# Patient Record
Sex: Female | Born: 2000 | Race: White | Hispanic: No | Marital: Single | Smoking: Current every day smoker
Health system: Southern US, Community
[De-identification: ages and names within clinical notes are randomized; demographics above are authoritative.]

## PROBLEM LIST (undated history)

## (undated) DIAGNOSIS — Z803 Family history of malignant neoplasm of breast: Secondary | ICD-10-CM

## (undated) HISTORY — DX: Family history of malignant neoplasm of breast: Z80.3

---
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
## 2020-02-06 ENCOUNTER — Ambulatory Visit (INDEPENDENT_AMBULATORY_CARE_PROVIDER_SITE_OTHER): Payer: 59 | Admitting: Physician Assistant

## 2020-02-06 ENCOUNTER — Encounter: Payer: Self-pay | Admitting: Physician Assistant

## 2020-02-06 ENCOUNTER — Other Ambulatory Visit: Payer: Self-pay

## 2020-02-06 DIAGNOSIS — F411 Generalized anxiety disorder: Secondary | ICD-10-CM | POA: Diagnosis not present

## 2020-02-06 DIAGNOSIS — F325 Major depressive disorder, single episode, in full remission: Secondary | ICD-10-CM | POA: Diagnosis not present

## 2020-02-06 MED ORDER — BUSPIRONE HCL 15 MG PO TABS: 15.0000 mg | ORAL_TABLET | Freq: Two times a day (BID) | ORAL | 3 refills | Status: DC

## 2020-02-06 NOTE — Progress Notes (Signed)
[facility]  Patient ID: Laura Lindsey,  MRN: [id number]  PCP: Jarrett Soho, PA-C  Date of Evaluation: 02/06/2020 Time spent:20 minutes  Chief Complaint:  Chief Complaint    Anxiety      HISTORY/CURRENT STATUS: HPI For routine follow-up   Still taking Buspar and doing well.  Panic attacks are extremely rare.  She does not even need the hydroxyzine anymore.  The generalized anxiety is well controlled.  She sleeps just fine.  She still works at [employer] but is looking into getting a job at [employer] [place].  Since we last talked, she and her boyfriend moved in together in a condo.  Things are going well in their relationship.  Patient denies loss of interest in usual activities and is able to enjoy things.  Denies decreased energy or motivation.  Appetite has not changed.  No extreme sadness, tearfulness, or feelings of hopelessness.  Denies any changes in concentration, making decisions or remembering things.  Denies suicidal or homicidal thoughts.  Denies dizziness, syncope, seizures, numbness, tingling, tremor, tics, unsteady gait, slurred speech, confusion. Denies muscle or joint pain, stiffness, or dystonia.  Individual Medical History/ Review of Systems: Changes? 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 Atarax, Zoloft, Cymbalta, Wellbutrin  Allergies: Robaxin [methocarbamol]  Current Medications:  Current Outpatient Medications:  .  busPIRone (BUSPAR) 15 MG tablet, Take 1 tablet (15 mg total) by mouth 2 (two) times daily., Disp: 180 tablet, Rfl: 3 .  norethindrone-ethinyl estradiol (JUNEL FE,GILDESS FE,LOESTRIN FE) 1-20 MG-MCG tablet, Take 1 tablet by mouth daily., Disp: , Rfl:  .  hydrOXYzine (ATARAX/VISTARIL) 10 MG tablet, TAKE 1-3 TABLETS (10-30 MG TOTAL) BY MOUTH 3 (THREE) TIMES DAILY AS NEEDED. (Patient not taking: Reported on 02/06/2020), Disp: 270  tablet, Rfl: 1 Medication Side Effects: none  Family Medical/ Social History: Changes? No  MENTAL HEALTH EXAM:  There were no vitals taken for this visit.There is no height or weight on file to calculate BMI.  General Appearance: Casual, Neat and Well Groomed  Eye Contact:  Good  Speech:  Clear and Coherent  Volume:  Normal  Mood:  Euthymic  Affect:  Appropriate  Thought Process:  Goal Directed  Orientation:  Full (Time, Place, and Person)  Thought Content: Logical   Suicidal Thoughts:  No  Homicidal Thoughts:  No  Memory:  WNL  Judgement:  Good  Insight:  Good  Psychomotor Activity:  Normal  Concentration:  Concentration: Good  Recall:  Good  Fund of Knowledge: Good  Language: Good  Assets:  Desire for Improvement  ADL's:  Intact  Cognition: WNL  Prognosis:  Good    DIAGNOSES:    ICD-10-CM   1. Generalized anxiety disorder  F41.1   2. 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 I provided 20 minutes of face-to-face time during this encounter. I am glad to see her doing well!   Continue BuSpar 15 mg 1 p.o. twice daily.  Return in 1 year.  Melony Overly, PA-C

## 2020-02-07 ENCOUNTER — Other Ambulatory Visit: Payer: Self-pay

## 2020-02-07 MED ORDER — BUSPIRONE HCL 15 MG PO TABS: 15.0000 mg | ORAL_TABLET | Freq: Two times a day (BID) | ORAL | 3 refills | Status: DC

## 2020-10-11 ENCOUNTER — Other Ambulatory Visit: Payer: Self-pay

## 2020-10-11 ENCOUNTER — Emergency Department (HOSPITAL_COMMUNITY)
Admission: EM | Admit: 2020-10-11 | Discharge: 2020-10-11 | Disposition: A | Discharge status: Home / Self Care | Payer: Managed Care, Other (non HMO) | Attending: Emergency Medicine | Admitting: Emergency Medicine

## 2020-10-11 ENCOUNTER — Emergency Department (HOSPITAL_COMMUNITY): Payer: Managed Care, Other (non HMO)

## 2020-10-11 DIAGNOSIS — F17290 Nicotine dependence, other tobacco product, uncomplicated: Secondary | ICD-10-CM | POA: Insufficient documentation

## 2020-10-11 DIAGNOSIS — Y990 Civilian activity done for income or pay: Secondary | ICD-10-CM | POA: Insufficient documentation

## 2020-10-11 DIAGNOSIS — S39012A Strain of muscle, fascia and tendon of lower back, initial encounter: Secondary | ICD-10-CM | POA: Insufficient documentation

## 2020-10-11 DIAGNOSIS — S3992XA Unspecified injury of lower back, initial encounter: Secondary | ICD-10-CM | POA: Diagnosis present

## 2020-10-11 DIAGNOSIS — R200 Anesthesia of skin: Secondary | ICD-10-CM | POA: Diagnosis not present

## 2020-10-11 DIAGNOSIS — Y93K3 Activity, grooming and shearing an animal: Secondary | ICD-10-CM | POA: Diagnosis not present

## 2020-10-11 DIAGNOSIS — X58XXXA Exposure to other specified factors, initial encounter: Secondary | ICD-10-CM | POA: Insufficient documentation

## 2020-10-11 DIAGNOSIS — M5416 Radiculopathy, lumbar region: Secondary | ICD-10-CM | POA: Diagnosis not present

## 2020-10-11 LAB — PREGNANCY, URINE: Preg Test, Ur: NEGATIVE

## 2020-10-11 MED ORDER — PREDNISONE 20 MG PO TABS
60.0000 mg | ORAL_TABLET | Freq: Once | ORAL | Status: AC
  Administered 2020-10-11: 60 mg via ORAL
  Filled 2020-10-11: qty 3

## 2020-10-11 MED ORDER — KETOROLAC TROMETHAMINE 30 MG/ML IJ SOLN
30.0000 mg | Freq: Once | INTRAMUSCULAR | Status: AC
  Administered 2020-10-11: 30 mg via INTRAMUSCULAR
  Filled 2020-10-11: qty 1

## 2020-10-11 MED ORDER — PREDNISONE 10 MG (21) PO TBPK: ORAL_TABLET | Freq: Every day | ORAL | 0 refills | Status: DC

## 2020-10-11 MED ORDER — ACETAMINOPHEN 325 MG PO TABS
650.0000 mg | ORAL_TABLET | Freq: Once | ORAL | Status: AC
  Administered 2020-10-11: 650 mg via ORAL
  Filled 2020-10-11: qty 2

## 2020-10-11 MED ORDER — CYCLOBENZAPRINE HCL 10 MG PO TABS: 10.0000 mg | ORAL_TABLET | Freq: Two times a day (BID) | ORAL | 0 refills | Status: DC | PRN

## 2020-10-11 MED ORDER — OXYCODONE-ACETAMINOPHEN 5-325 MG PO TABS
1.0000 | ORAL_TABLET | Freq: Once | ORAL | Status: AC
Start: 2020-10-11 — End: 2020-10-11
  Administered 2020-10-11: 1 via ORAL
  Filled 2020-10-11: qty 1

## 2020-10-11 NOTE — ED Triage Notes (Signed)
Pt complains of sciatic pain, radiating down right leg. She now reports her right leg feels numb and that back pain is worse.

## 2020-10-11 NOTE — ED Provider Notes (Signed)
[facility]-EMERGENCY DEPT Provider Note   CSN: [id number] Arrival date & time: 10/11/20  1227     History Chief Complaint  Patient presents with   Back Pain   Numbness    Laura Lindsey is a 20 y.o. female with a past medical history of prior spinal fusion surgery in 2016 presenting to the ED with a chief complaint of back pain.  States that she has had pain in her back for several years.  This worsened today while she was at work.  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  She started having worsening pain throughout her entire lower back.  Has paresthesias to the top of her foot and her upper thigh area.  Denies any saddle anesthesia, loss of bowel or bladder function, fever, chest pain, shortness of breath, injuries or falls or possibility of pregnancy.  She describes the sensation as " like when your arm falls asleep."  She has been taking NSAIDs with only minimal improvement in her symptoms.  HPI     No past medical history on file.  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     Family History  Problem Relation Age of Onset   Breast cancer Mother     Social History   Tobacco Use   Smoking status: Every Day    Pack years: 0.00    Types: E-cigarettes   Smokeless tobacco: Never  Substance Use Topics   Alcohol use: Yes    Alcohol/week: 2.0 standard drinks    Types: 2 Cans of beer per week   Drug use: Not Currently    Home Medications Prior to Admission medications   Medication Sig Start Date End Date Taking? Authorizing Provider  cyclobenzaprine (FLEXERIL) 10 MG tablet Take 1 tablet (10 mg total) by  mouth 2 (two) times daily as needed for muscle spasms. 10/11/20  Yes Ellese Julius, PA-C  predniSONE (STERAPRED UNI-PAK 21 TAB) 10 MG (21) TBPK tablet Take by mouth daily. Take 6 tabs by mouth daily  for 2 days, then 5 tabs for 2 days, then 4 tabs for 2 days, then 3 tabs for 2 days, 2 tabs for 2 days, then 1 tab by mouth daily for 2 days 10/11/20  Yes Illyana Schorsch, PA-C  busPIRone (BUSPAR) 15 MG tablet Take 1 tablet (15 mg total) by mouth 2 (two) times daily. 02/07/20   Melony Overly T, PA-C  hydrOXYzine (ATARAX/VISTARIL) 10 MG tablet TAKE 1-3 TABLETS (10-30 MG TOTAL) BY MOUTH 3 (THREE) TIMES DAILY AS NEEDED. Patient not taking: Reported on 02/06/2020 09/02/18   Melony Overly T, PA-C  norethindrone-ethinyl estradiol (JUNEL FE,GILDESS FE,LOESTRIN FE) 1-20 MG-MCG tablet Take 1 tablet by mouth daily.    [provider]    Allergies    Robaxin [methocarbamol]  Review of Systems   Review of Systems  Constitutional:  Negative for appetite change, chills and fever.  HENT:  Negative for ear pain, rhinorrhea, sneezing and sore throat.   Eyes:  Negative for photophobia and visual disturbance.  Respiratory:  Negative for cough, chest tightness, shortness of breath and wheezing.   Cardiovascular:  Negative for chest pain and palpitations.  Gastrointestinal:  Negative for abdominal pain, blood in stool, constipation, diarrhea, nausea and vomiting.  Genitourinary:  Negative for dysuria, hematuria and urgency.  Musculoskeletal:  Positive for back pain. Negative for myalgias.  Skin:  Negative for rash.  Neurological:  Negative for dizziness, weakness and light-headedness.   Physical Exam Updated Vital Signs BP 126/65   Pulse (!) 58   Temp 98.6 F (37 C) (Oral)   Resp 16   Ht 5\' 9"  (1.753 m)   Wt 62.8 kg   LMP 09/22/2020   SpO2 100%   BMI 20.43 kg/m   Physical Exam Vitals and nursing note reviewed.  Constitutional:      General: She is not in acute distress.    Appearance: She is  well-developed.  HENT:     Head: Normocephalic and atraumatic.     Nose: Nose normal.  Eyes:     General: No scleral icterus.       Left eye: No discharge.     Conjunctiva/sclera: Conjunctivae normal.  Cardiovascular:     Rate and Rhythm: Normal rate and regular rhythm.     Heart sounds: Normal heart sounds. No murmur heard.   No friction rub. No gallop.  Pulmonary:     Effort: Pulmonary effort is normal. No respiratory distress.     Breath sounds: Normal breath sounds.  Abdominal:     General: Bowel sounds are normal. There is no distension.     Palpations: Abdomen is soft.     Tenderness: There is no abdominal tenderness. There is no guarding.  Musculoskeletal:        General: Tenderness present. Normal range of motion.     Cervical back: Normal range of motion and neck supple.     Lumbar back: Tenderness present.     Comments: Tenderness to palpation of the lumbar spine and bilateral paraspinal musculature.  Strength 5/5 in bilateral lower extremities.  2+ DP pulse noted bilaterally.  Normal sensation to light touch of bilateral upper extremities.  No calf tenderness bilaterally.  No edema noted bilaterally.  No midline tenderness at the L-spine.  Ambulatory with normal gait. No objective signs of saddle anesthesia noted.  Skin:    General: Skin is warm and dry.     Findings: No rash.  Neurological:     Mental Status: She is alert.     Motor: No abnormal muscle tone.     Coordination: Coordination normal.    ED Results / Procedures / Treatments   Labs (all labs ordered are listed, but only abnormal results are displayed) Labs Reviewed  PREGNANCY, URINE    EKG None  Radiology DG Lumbar Spine Complete  Result Date: 10/11/2020 CLINICAL DATA:  Low back pain for 2 weeks with bilateral sciatica, initial encounter EXAM: LUMBAR SPINE - COMPLETE 4+ VIEW COMPARISON:  None. FINDINGS: Postsurgical changes are noted at the thoracolumbar junction extending to L2. Vertebral body  height is well maintained. No pars defects are noted. Disc space narrowing is noted from L3-S1. No soft tissue abnormality is noted. IMPRESSION: Postsurgical changes in the thoracolumbar spine. No acute abnormality is noted. Electronically Signed   By: 10/13/2020 M.D.   On: 10/11/2020 16:43    Procedures Procedures   Medications Ordered in ED Medications  ketorolac (TORADOL) 30 MG/ML injection 30 mg (30 mg Intramuscular Given 10/11/20 1714)  acetaminophen (TYLENOL) tablet 650 mg (650 mg Oral Given 10/11/20 1713)  oxyCODONE-acetaminophen (PERCOCET/ROXICET) 5-325 MG per tablet 1  tablet (1 tablet Oral Given 10/11/20 1713)  predniSONE (DELTASONE) tablet 60 mg (60 mg Oral Given 10/11/20 1713)    ED Course  I have reviewed the triage vital signs and the nursing notes.  Pertinent labs & imaging results that were available during my care of the patient were reviewed by me and considered in my medical decision making (see chart for details).    MDM Rules/Calculators/A&P                          20 year old female presenting to the ED with a chief complaint of back pain.  History of spinal fusion in 2016.  She has been dealing with pain for several years but this worsened today while at work.  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  Has been taking NSAIDs with minimal improvement today.  Reports sharp shooting pain down her right leg as well as paresthesias in her foot and thigh.  Denies any saddle anesthesia, loss of bowel or bladder function, fever, injury or fall, shortness of breath or possibility of pregnancy.  On exam patient with tenderness of the lumbar spine at the bilateral paraspinal musculature.  Strength 5/5 in bilateral lower extremities.  Normal sensation of lower extremities.  No objective signs of numbness.  She is ambulatory here.  Vital signs within normal limits.  X-ray without any acute findings.  She has no red flag symptoms that would concern me  for cauda equina, dissection or other surgical emergent cause.  Pregnancy test is negative.  Pain controlled here with medications.  She reports significant improvement and is requesting discharge.  States that she has set up appointment with her PCP and is interested in physical therapy.  Meantime we will treat symptoms at home with continued steroid pack and Flexeril.  Return precautions given.   Patient is hemodynamically stable, in NAD, and able to ambulate in the ED. Evaluation does not show pathology that would require ongoing emergent intervention or inpatient treatment. I explained the diagnosis to the patient. Pain has been managed and has no complaints prior to discharge. Patient is comfortable with above plan and is stable for discharge at this time. All questions were answered prior to disposition. Strict return precautions for returning to the ED were discussed. Encouraged follow up with PCP.   An After Visit Summary was printed and given to the patient.   Portions of this note were generated with Scientist, clinical (histocompatibility and immunogenetics). Dictation errors may occur despite best attempts at proofreading.  Final Clinical Impression(s) / ED Diagnoses Final diagnoses:  Lumbar radiculopathy  Strain of lumbar region, initial encounter    Rx / DC Orders ED Discharge Orders          Ordered    predniSONE (STERAPRED UNI-PAK 21 TAB) 10 MG (21) TBPK tablet  Daily        10/11/20 1800    cyclobenzaprine (FLEXERIL) 10 MG tablet  2 times daily PRN        10/11/20 1800             Dietrich Pates, PA-C 10/11/20 1801    Rolan Bucco, MD 10/11/20 2349

## 2020-10-11 NOTE — Discharge Instructions (Addendum)
Take the steroids beginning tomorrow. Take the muscle relaxer as needed in addition to Tylenol. Follow-up with your primary care provider. Return to the ER if you start to experience worsening back pain, losing control of her bowels or bladder, fever, injuries or falls.

## 2021-02-04 ENCOUNTER — Ambulatory Visit (INDEPENDENT_AMBULATORY_CARE_PROVIDER_SITE_OTHER): Payer: 59 | Admitting: Physician Assistant

## 2021-02-04 ENCOUNTER — Encounter: Payer: Self-pay | Admitting: Physician Assistant

## 2021-02-04 ENCOUNTER — Other Ambulatory Visit: Payer: Self-pay

## 2021-02-04 DIAGNOSIS — F325 Major depressive disorder, single episode, in full remission: Secondary | ICD-10-CM

## 2021-02-04 DIAGNOSIS — F4320 Adjustment disorder, unspecified: Secondary | ICD-10-CM | POA: Diagnosis not present

## 2021-02-04 DIAGNOSIS — F411 Generalized anxiety disorder: Secondary | ICD-10-CM

## 2021-02-04 MED ORDER — BUSPIRONE HCL 15 MG PO TABS: ORAL_TABLET | ORAL | 3 refills | Status: DC

## 2021-02-04 MED ORDER — DIAZEPAM 5 MG PO TABS: 2.5000 mg | ORAL_TABLET | Freq: Four times a day (QID) | ORAL | 0 refills | Status: DC | PRN

## 2021-02-04 NOTE — Progress Notes (Signed)
[facility]  Patient ID: Laura Lindsey,  MRN: [id number]  PCP: Jarrett Soho, PA-C  Date of Evaluation: 02/04/2021 Time spent:30 minutes  Chief Complaint:  Chief Complaint   Anxiety; Follow-up      HISTORY/CURRENT STATUS: HPI For routine follow-up   Has had bad news this year.  Her mom was diagnosed with stage IV breast cancer in April, when she went to the emergency room for back pain, thinking it was a kidney stone but then got that diagnosis.  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  She is not having any pain right now.  Laura Lindsey is understandably upset, "my emotions are all over the place.  You never know when I get really sad and anxious about it.  Of course I cry when I feel that way."  She is sleeping fairly well.  Energy and motivation are good.  Appetite has not changed and weight is stable.  She does feel sad a lot of course with everything going on.  Does not really want to add an antidepressant at this point but she does ask if we can do something as far as the BuSpar goes.  Up until the sad news, it was working well.  Denies any changes in concentration, making decisions or remembering things.  Denies suicidal or homicidal thoughts.  Denies dizziness, syncope, seizures, numbness, tingling, tremor, tics, unsteady gait, slurred speech, confusion. Denies muscle or joint pain, stiffness, or dystonia.  Individual Medical History/ Review of Systems: Changes? :Yes   had lumbar radiculopathy, had to go to the ER in June for that.  Had PT for 3 months.  Doing better now.  Past medications for mental health diagnoses include: Valium for muscle spasms in the past,  Atarax, Zoloft, Cymbalta, Wellbutrin  Allergies: Robaxin [methocarbamol]  Current Medications:  Current Outpatient Medications:    diazepam (VALIUM) 5 MG tablet, Take 0.5-1 tablets (2.5-5 mg total) by mouth every 6 (six) hours as needed for anxiety., Disp: 20 tablet,  Rfl: 0   norethindrone-ethinyl estradiol (JUNEL FE,GILDESS FE,LOESTRIN FE) 1-20 MG-MCG tablet, Take 1 tablet by mouth daily., Disp: , Rfl:    busPIRone (BUSPAR) 15 MG tablet, 2 po q morning, 1 po q evening., Disp: 270 tablet, Rfl: 3   hydrOXYzine (ATARAX/VISTARIL) 10 MG tablet, TAKE 1-3 TABLETS (10-30 MG TOTAL) BY MOUTH 3 (THREE) TIMES DAILY AS NEEDED. (Patient not taking: No sig reported), Disp: 270 tablet, Rfl: 1 Medication Side Effects: none  Family Medical/ Social History: Changes?  In April her Mom dx IV breast cancer, and a few weeks ago found out she has 6 months to live. She was dx originally around 2019.  She and BF live together. That's going well. 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  She bathes dogs and really loves it.  MENTAL HEALTH EXAM:  There were no vitals taken for this visit.There is no height or weight on file to calculate BMI.  General Appearance: Casual, Neat and Well Groomed  Eye Contact:  Good  Speech:  Clear and Coherent  Volume:  Normal  Mood:   Sad  Affect:  Congruent and Tearful  Thought Process:  Goal Directed  Orientation:  Full (Time, Place, and Person)  Thought Content: Logical   Suicidal Thoughts:  No  Homicidal Thoughts:  No  Memory:  WNL  Judgement:  Good  Insight:  Good  Psychomotor Activity:  Normal  Concentration:  Concentration: Good  Recall:  Good  Fund of Knowledge: Good  Language: Good  Assets:  Desire for Improvement  ADL's:  Intact  Cognition: WNL  Prognosis:  Good    DIAGNOSES:    ICD-10-CM   1. Generalized anxiety disorder  F41.1     2. Anticipatory grief  F43.20     3. 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  06/11/2020 hydrocodone filled.  No other controlled substances. I provided 30 minutes of face to face time during this encounter, including time spent before and after  the visit in records review, medical decision making, and charting.  I am sorry to hear this sad news.  [name] and I discussed increasing the BuSpar.  And I also recommend adding a benzodiazepine just in case she needs it.  She has taken Valium in the past for muscle spasms.  She was not addicted but in the past she said she did like the way she felt on them.  She is very aware that they are addictive and she will use with caution.  Benefits, risks, and side effects were discussed and she accepts.  Increasing the BuSpar will hopefully help prevent the need for a benzodiazepine. Increase BuSpar 15 mg to 2 p.o. every morning and 1 p.o. in the early evening. Start Valium 5 mg, 1/2-1 p.o. every 6 hours as needed anxiety. Consider counseling. Return in 2 months.  Melony Overly, PA-C

## 2021-02-25 ENCOUNTER — Ambulatory Visit: Payer: 59 | Admitting: Physician Assistant

## 2021-03-02 ENCOUNTER — Other Ambulatory Visit: Payer: Self-pay

## 2021-03-02 ENCOUNTER — Ambulatory Visit (INDEPENDENT_AMBULATORY_CARE_PROVIDER_SITE_OTHER): Payer: 59 | Admitting: Physician Assistant

## 2021-03-02 ENCOUNTER — Encounter: Payer: Self-pay | Admitting: Physician Assistant

## 2021-03-02 DIAGNOSIS — F422 Mixed obsessional thoughts and acts: Secondary | ICD-10-CM

## 2021-03-02 DIAGNOSIS — F411 Generalized anxiety disorder: Secondary | ICD-10-CM

## 2021-03-02 DIAGNOSIS — F4320 Adjustment disorder, unspecified: Secondary | ICD-10-CM | POA: Diagnosis not present

## 2021-03-02 MED ORDER — CITALOPRAM HYDROBROMIDE 10 MG PO TABS: 10.0000 mg | ORAL_TABLET | ORAL | 1 refills | Status: DC

## 2021-03-02 NOTE — Progress Notes (Signed)
[facility]  Patient ID: Laura Lindsey,  MRN: [id number]  PCP: Jarrett Soho, PA-C  Date of Evaluation: 03/02/2021 Time spent:30 minutes  Chief Complaint:  Chief Complaint   Anxiety; Depression; Follow-up     HISTORY/CURRENT STATUS: HPI For routine follow-up   Since increasing the Buspar, feels better with day to day stuff. But OCD sx of having to clean her house or she can't do anything else, there are things like emptying the dishwasher that if she does not do it before leaving for work she has to go back inside and do it.  She is also cleaning a lot at work.  The symptoms worsened about 1 month ago.  She has always had some OCD tendencies but never like this.  She is not having panic attacks.  She is having a hard time with fatigue, appetite is decreased and she is lost about 15 pounds in the past 3 or 4 months.  She gets hungry but then can only eat a few bites and gets nauseated.  She does not throw up.  She has a hard time enjoying things but does go to work.  Not missing any work.  Sleeps too much.  No self-harm.  She does cry easily.  No suicidal or homicidal thoughts.  Her mom has moved to [place] to be in the care of her parents.  She is needing 24/7 care now.  She stopped chemo.  That is hard on Laura Lindsey because she cannot pop over and see her at any time but she knows it is the best for her mom having that care.  She had been in and out of behavioral health and she needed more care.  Laura Lindsey has taken in the family cat since her mom's move.  She asked about having a note for an emotional support animal.  Her cat is indoors, sleeps most of the time and is a really sweet cat.  Patient denies increased energy with decreased need for sleep, no increased talkativeness, no racing thoughts, no impulsivity or risky behaviors, no increased spending, no increased libido, no grandiosity, no increased irritability or anger, and no hallucinations.  Denies dizziness, syncope,  seizures, numbness, tingling, tremor, tics, unsteady gait, slurred speech, confusion. Denies muscle or joint pain, stiffness, or dystonia.  Individual Medical History/ Review of Systems: Changes? :Yes   see HPI  Past medications for mental health diagnoses include: Valium for muscle spasms in the past,  Atarax, Zoloft, Cymbalta, Wellbutrin  Allergies: Robaxin [methocarbamol]  Current Medications:  Current Outpatient Medications:    busPIRone (BUSPAR) 15 MG tablet, 2 po q morning, 1 po q evening., Disp: 270 tablet, Rfl: 3   citalopram (CELEXA) 10 MG tablet, Take 1 tablet (10 mg total) by mouth every morning., Disp: 30 tablet, Rfl: 1   diazepam (VALIUM) 5 MG tablet, Take 0.5-1 tablets (2.5-5 mg total) by mouth every 6 (six) hours as needed for anxiety., Disp: 20 tablet, Rfl: 0   norethindrone-ethinyl estradiol (JUNEL FE,GILDESS FE,LOESTRIN FE) 1-20 MG-MCG tablet, Take 1 tablet by mouth daily., Disp: , Rfl:  Medication Side Effects: none  Family Medical/ Social History: Changes?  See HPI.   MENTAL HEALTH EXAM:  There were no vitals taken for this visit.There is no height or weight on file to calculate BMI.  General Appearance: Casual, Neat and Well Groomed  Eye Contact:  Good  Speech:  Clear and Coherent  Volume:  Normal  Mood:  Depressed  Affect:  Congruent and Depressed  Thought Process:  Goal Directed  Orientation:  Full (Time, Place, and Person)  Thought Content: Logical   Suicidal Thoughts:  No  Homicidal Thoughts:  No  Memory:  WNL  Judgement:  Good  Insight:  Good  Psychomotor Activity:  Normal  Concentration:  Concentration: Good  Recall:  Good  Fund of Knowledge: Good  Language: Good  Assets:  Desire for Improvement  ADL's:  Intact  Cognition: WNL  Prognosis:  Good    DIAGNOSES:    ICD-10-CM   1. Generalized anxiety disorder  F41.1     2. Mixed obsessional thoughts and acts  F42.2     3. Anticipatory grief  F43.20         Receiving Psychotherapy: Yes   Danae Orleans    RECOMMENDATIONS: PDMP was reviewed.  Valium filled 02/04/2021. I provided 30 minutes of face to face time during this encounter, including time spent before and after the visit in records review, medical decision making, and charting.  We discussed starting an antidepressant, not only for depression but also anxiety prevention and will help with OCD somewhat.  She has a couple of friends who take antidepressants, 1 is doing well on Celexa, the other 1 is doing well on Effexor.  Alyana has taken Cymbalta and had a horrible experience with that especially weaning off so we agree Effexor is not a good choice.  She understands that Celexa is in the same family of Zoloft but I think it would be better for her symptoms.  Benefits, risk, and side effects were discussed and she accepts and would like to try it. Note for an emotional support animal stating "Laura Lindsey has a cat who is an emotional support animal.  This is an important part of her treatment plan."  She will let me know if she needs a formal letter dictated which I will be glad to do. Start Celexa 10 mg, 1 p.o. every morning daily.  If it makes her drowsy at all she can change to evening. Continue BuSpar 15 mg, continue 2 p.o. every morning and 1 p.o. in the early evening. Start Valium 5 mg, 1/2-1 p.o. every 6 hours as needed anxiety. Continue counseling with Danae Orleans.  Return in 4-6 weeks.  Melony Overly, PA-C

## 2021-03-24 ENCOUNTER — Other Ambulatory Visit: Payer: Self-pay | Admitting: Physician Assistant

## 2021-04-21 ENCOUNTER — Other Ambulatory Visit: Payer: Self-pay | Admitting: Physician Assistant

## 2021-04-22 ENCOUNTER — Other Ambulatory Visit: Payer: Self-pay

## 2021-04-22 ENCOUNTER — Encounter: Payer: Self-pay | Admitting: Physician Assistant

## 2021-04-22 ENCOUNTER — Ambulatory Visit (INDEPENDENT_AMBULATORY_CARE_PROVIDER_SITE_OTHER): Payer: 59 | Admitting: Physician Assistant

## 2021-04-22 ENCOUNTER — Ambulatory Visit: Payer: 59 | Admitting: Physician Assistant

## 2021-04-22 DIAGNOSIS — F411 Generalized anxiety disorder: Secondary | ICD-10-CM

## 2021-04-22 DIAGNOSIS — F3341 Major depressive disorder, recurrent, in partial remission: Secondary | ICD-10-CM

## 2021-04-22 DIAGNOSIS — F422 Mixed obsessional thoughts and acts: Secondary | ICD-10-CM | POA: Diagnosis not present

## 2021-04-22 MED ORDER — CITALOPRAM HYDROBROMIDE 10 MG PO TABS: 10.0000 mg | ORAL_TABLET | ORAL | 3 refills | Status: DC

## 2021-04-22 NOTE — Progress Notes (Signed)
[facility]  Patient ID: Laura Lindsey,  MRN: [id number]  PCP: Jarrett Soho, PA-C  Date of Evaluation: 04/22/2021 Time spent:20 minutes  Chief Complaint:  Chief Complaint   Anxiety; Depression; Follow-up      HISTORY/CURRENT STATUS: HPI For routine follow-up   Started Celexa 6-7 weeks ago, it has helped with the OCD symptoms.  She still has what she calls "fits" where she will have obsessive thoughts or compulsions that she has to get up and cleaning everything or do whatever task it is she is thinking about.  Those only happen a couple of times a week at most.  And it is hard to say how long they last.  When it is really bad she takes a Valium and that helps.  Generalized anxiety is better.  BuSpar is helping that as well.  She is able to enjoy things as much as she can.  Feels that the Celexa has helped with symptoms of depression.  She was able to go see her mom over Christmas break.  Her mom has metastatic breast cancer and is doing pretty well mentally right now.  She has a PET scan coming up.  Deon's energy level and motivation are good.  Work is going fine.  She sleeps well.  ADLs are normal.  Personal hygiene is normal.  She is not isolating.  No suicidal or homicidal thoughts.  She denies increased energy with decreased need for sleep, no increased talkativeness, no racing thoughts, no impulsivity or risky behaviors, no increased spending, no increased libido, no grandiosity, no increased irritability or anger, and no hallucinations.  Denies dizziness, syncope, seizures, numbness, tingling, tremor, tics, unsteady gait, slurred speech, confusion. Denies muscle or joint pain, stiffness, or dystonia.  Individual Medical History/ Review of Systems: Changes? :No     Past medications for mental health diagnoses include: Valium for muscle spasms in the past,  Atarax, Zoloft, Cymbalta, Wellbutrin  Allergies: Robaxin [methocarbamol]  Current Medications:  Current  Outpatient Medications:    busPIRone (BUSPAR) 15 MG tablet, 2 po q morning, 1 po q evening., Disp: 270 tablet, Rfl: 3   diazepam (VALIUM) 5 MG tablet, Take 0.5-1 tablets (2.5-5 mg total) by mouth every 6 (six) hours as needed for anxiety., Disp: 20 tablet, Rfl: 0   norethindrone-ethinyl estradiol (JUNEL FE,GILDESS FE,LOESTRIN FE) 1-20 MG-MCG tablet, Take 1 tablet by mouth daily., Disp: , Rfl:    citalopram (CELEXA) 10 MG tablet, Take 1 tablet (10 mg total) by mouth every morning., Disp: 90 tablet, Rfl: 3 Medication Side Effects: sexual dysfunction since starting the Celexa, she has no sex drive at all.  She and her boyfriend feel that it is not a problem right now because she needs the Celexa.  Family Medical/ Social History: Changes?  See HPI.   MENTAL HEALTH EXAM:  There were no vitals taken for this visit.There is no height or weight on file to calculate BMI.  General Appearance: Casual, Neat and Well Groomed  Eye Contact:  Good  Speech:  Clear and Coherent  Volume:  Normal  Mood:  Euthymic  Affect:  Congruent  Thought Process:  Goal Directed and Descriptions of Associations: Circumstantial  Orientation:  Full (Time, Place, and Person)  Thought Content: Logical   Suicidal Thoughts:  No  Homicidal Thoughts:  No  Memory:  WNL  Judgement:  Good  Insight:  Good  Psychomotor Activity:  Normal  Concentration:  Concentration: Good  Recall:  Good  Fund of Knowledge: Good  Language: Good  Assets:  Desire for Improvement  ADL's:  Intact  Cognition: WNL  Prognosis:  Good    DIAGNOSES:    ICD-10-CM   1. Recurrent major depressive disorder, in partial remission (HCC)  F33.41     2. Mixed obsessional thoughts and acts  F42.2     3. Generalized anxiety disorder  F41.1        Receiving Psychotherapy: Yes  Danae Orleans    RECOMMENDATIONS: PDMP was reviewed.  Last Valium filled 02/04/2021. I provided 20 minutes of face to face time during this encounter, including time spent  before and after the visit in records review, medical decision making, counseling pertinent to today's visit, and charting.  I am glad to see her doing better.  It is good to hear that her mom is doing okay now as well. We discussed the decreased libido, she is willing to put up with it for now.  Neither of [place] want to add another medication such as Wellbutrin or try another medication that is also likely to cause sexual side effects so we will continue same treatment for now. Continue Celexa 10 mg, 1 p.o. daily.   Continue BuSpar 15 mg, continue 2 p.o. every morning and 1 p.o. in the early evening. Continue Valium 5 mg, 1/2-1 p.o. every 6 hours as needed anxiety.  Okay to refill when needed. Continue counseling with Danae Orleans.  Return in 3 months.  Melony Overly, PA-C

## 2021-07-22 ENCOUNTER — Ambulatory Visit (INDEPENDENT_AMBULATORY_CARE_PROVIDER_SITE_OTHER): Payer: 59 | Admitting: Physician Assistant

## 2021-07-22 ENCOUNTER — Encounter: Payer: Self-pay | Admitting: Physician Assistant

## 2021-07-22 DIAGNOSIS — F411 Generalized anxiety disorder: Secondary | ICD-10-CM

## 2021-07-22 DIAGNOSIS — F19981 Other psychoactive substance use, unspecified with psychoactive substance-induced sexual dysfunction: Secondary | ICD-10-CM

## 2021-07-22 DIAGNOSIS — F3341 Major depressive disorder, recurrent, in partial remission: Secondary | ICD-10-CM

## 2021-07-22 DIAGNOSIS — F422 Mixed obsessional thoughts and acts: Secondary | ICD-10-CM | POA: Diagnosis not present

## 2021-07-22 NOTE — Progress Notes (Signed)
[facility] ? ?Patient ID: Erroll Luna,  ?MRN: [id number] ? ?PCP: Jarrett Soho, PA-C ? ?Date of Evaluation: 07/22/2021 ?Time spent:20 minutes ? ?Chief Complaint:  ?Chief Complaint   ?Anxiety; Depression; Follow-up ?  ? ? ? ?HISTORY/CURRENT STATUS: ?HPI For routine follow-up  ? ?Doing really well. Celexa has helped so much with the anxiety and depression.  Her work is going well, she is not missing days.  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  Her mom is doing well (has metastatic breast cancer) so that is helpful too.  She is able to enjoy things.  Energy and motivation are good.  She has not been taking the Valium at all.  Wonders if she even needs the BuSpar anymore and has been thinking about going off, would like to know my opinion.  Sleeps well.  Not isolating.  ADLs and personal hygiene are normal.  Not obsessing about things like she did.  No skin picking or trichotillomania.  No suicidal or homicidal thoughts. ? ?She denies increased energy with decreased need for sleep, no increased talkativeness, no racing thoughts, no impulsivity or risky behaviors, no increased spending, no increased libido, no grandiosity, no increased irritability or anger, and no hallucinations. ? ?Denies dizziness, syncope, seizures, numbness, tingling, tremor, tics, unsteady gait, slurred speech, confusion. Denies muscle or joint pain, stiffness, or dystonia. ? ?Individual Medical History/ Review of Systems: Changes? :No    ? ?Past medications for mental health diagnoses include: ?Valium for muscle spasms in the past,  Atarax, Zoloft, Cymbalta, Wellbutrin ? ?Allergies: Robaxin [methocarbamol] ? ?Current Medications:  ?Current Outpatient Medications:  ?  busPIRone (BUSPAR) 15 MG tablet, 2 po q morning, 1 po q evening., Disp: 270 tablet, Rfl: 3 ?  citalopram (CELEXA) 10 MG tablet, Take 1 tablet (10 mg total) by mouth every morning., Disp: 90 tablet, Rfl: 3 ?  norethindrone-ethinyl estradiol (JUNEL  FE,GILDESS FE,LOESTRIN FE) 1-20 MG-MCG tablet, Take 1 tablet by mouth daily., Disp: , Rfl:  ?  diazepam (VALIUM) 5 MG tablet, Take 0.5-1 tablets (2.5-5 mg total) by mouth every 6 (six) hours as needed for anxiety. (Patient not taking: Reported on 07/22/2021), Disp: 20 tablet, Rfl: 0 ?Medication Side Effects: sexual dysfunction since starting the Celexa,  ? ?Family Medical/ Social History: Changes?  ?See HPI. ? ? ?MENTAL HEALTH EXAM: ? ?There were no vitals taken for this visit.There is no height or weight on file to calculate BMI.  ?General Appearance: Casual, Neat and Well Groomed  ?Eye Contact:  Good  ?Speech:  Clear and Coherent  ?Volume:  Normal  ?Mood:  Euthymic  ?Affect:  Congruent  ?Thought Process:  Goal Directed and Descriptions of Associations: Circumstantial  ?Orientation:  Full (Time, Place, and Person)  ?Thought Content: Logical   ?Suicidal Thoughts:  No  ?Homicidal Thoughts:  No  ?Memory:  WNL  ?Judgement:  Good  ?Insight:  Good  ?Psychomotor Activity:  Normal  ?Concentration:  Concentration: Good and Attention Span: Good  ?Recall:  Good  ?Fund of Knowledge: Good  ?Language: Good  ?Assets:  Desire for Improvement  ?ADL's:  Intact  ?Cognition: WNL  ?Prognosis:  Good  ? ? ?DIAGNOSES:  ?  ICD-10-CM   ?1. Recurrent major depressive disorder, in partial remission (HCC)  F33.41   ?  ?2. Generalized anxiety disorder  F41.1   ?  ?3. Mixed obsessional thoughts and acts  F42.2   ?  ?4. 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  ? ? ?  Receiving Psychotherapy: No  Danae Orleans in the past. ? ? ?RECOMMENDATIONS: ?PDMP was reviewed.  Last Valium filled 02/04/2021. ?I provided 20 minutes of face to face time during this encounter, including time spent before and after the visit in records review, medical decision making, counseling pertinent to today's visit, and charting.  ?Discussed the sexual side effects.  Unfortunately it is a side effect most likely of the Celexa over BuSpar however she  does not feel like she needs the BuSpar anymore so she can wean off that by taking 15 mg twice daily for 4 days then 15 mg daily for 4 days and then stop.  Weaning is not necessary but I always think it is best.  And if she wants to wean longer that is fine too. Might consider decreasing the Celexa in the future to see if that will help with the sexual side effects, adding Wellbutrin may be helpful as well.  For now no changes with that. ? ?Continue Celexa 10 mg, 1 p.o. daily.   ?Wean off BuSpar as noted above.   ?Continue Valium 5 mg, 1/2-1 p.o. every 6 hours as needed anxiety.  She has not taken this in a long time.   ?Restart therapy with Danae Orleans as needed. ?Return in 6 months. ? ?Melony Overly, PA-C  ?

## 2022-01-20 ENCOUNTER — Encounter: Payer: Self-pay | Admitting: Physician Assistant

## 2022-01-20 ENCOUNTER — Ambulatory Visit (INDEPENDENT_AMBULATORY_CARE_PROVIDER_SITE_OTHER): Payer: 59 | Admitting: Physician Assistant

## 2022-01-20 DIAGNOSIS — F411 Generalized anxiety disorder: Secondary | ICD-10-CM

## 2022-01-20 DIAGNOSIS — F422 Mixed obsessional thoughts and acts: Secondary | ICD-10-CM | POA: Diagnosis not present

## 2022-01-20 DIAGNOSIS — F331 Major depressive disorder, recurrent, moderate: Secondary | ICD-10-CM

## 2022-01-20 MED ORDER — DIAZEPAM 5 MG PO TABS: 2.5000 mg | ORAL_TABLET | Freq: Four times a day (QID) | ORAL | 0 refills | Status: DC | PRN

## 2022-01-20 MED ORDER — CITALOPRAM HYDROBROMIDE 20 MG PO TABS: 20.0000 mg | ORAL_TABLET | Freq: Every day | ORAL | 1 refills | Status: DC

## 2022-01-20 NOTE — Progress Notes (Signed)
[facility]  Patient ID: Laura Lindsey,  MRN: [id number]  PCP: Jarrett Soho, PA-C  Date of Evaluation: 01/20/2022 Time spent:20 minutes  Chief Complaint:  Chief Complaint   Anxiety; Depression; Follow-up    HISTORY/CURRENT STATUS: HPI For routine follow-up   OCD is much worse over the past few months.  Everything has to be in its place at home or else she cannot relax at all.  She will keep thinking about it over and over and over until she gets up and does what ever it is that she is obsessing about.  This goes on at work as well.  States her coworkers tell her that she needs to chill because she is getting in their things trying to organize those because it bugs her a lot to see things crooked or not and place.  She cleans house thoroughly every day.  She vacuums, does not eat cleaning on the kitchen after every meal.  States it is exhausting both physically and mentally.  Depression symptoms are mostly controlled.  Sometimes she will feel kind of down and not have a lot of energy or motivation.  The Celexa has been very helpful for that and the generalized anxiety.  We stopped BuSpar at the last visit and she did not notice any change in anxiety symptoms.  Not having panic attacks but sometimes she does get so overwhelmed that she needs to take the Valium.  She was given 20 pills last October and she still has 6 left.  Appetite is normal and weight is stable.  Personal hygiene is normal.  Not isolating.  Does not cry easily.  Sleeps well.  No suicidal or homicidal thoughts.  She recently visited her mom in [place].  She has stage IV breast cancer and is doing very well right now.  Patient denies increased energy with decreased need for sleep, increased talkativeness, racing thoughts, impulsivity or risky behaviors, increased spending, increased libido, grandiosity, increased irritability or anger, paranoia, or hallucinations.  Denies dizziness, syncope, seizures,  numbness, tingling, tremor, tics, unsteady gait, slurred speech, confusion. Denies muscle or joint pain, stiffness, or dystonia.  Individual Medical History/ Review of Systems: Changes? :No     Past medications for mental health diagnoses include: Valium for muscle spasms in the past,  Atarax, Zoloft, Cymbalta, Wellbutrin  Allergies: Robaxin [methocarbamol]  Current Medications:  Current Outpatient Medications:    citalopram (CELEXA) 20 MG tablet, Take 1 tablet (20 mg total) by mouth daily., Disp: 90 tablet, Rfl: 1   norethindrone-ethinyl estradiol (JUNEL FE,GILDESS FE,LOESTRIN FE) 1-20 MG-MCG tablet, Take 1 tablet by mouth daily., Disp: , Rfl:    diazepam (VALIUM) 5 MG tablet, Take 0.5-1 tablets (2.5-5 mg total) by mouth every 6 (six) hours as needed for anxiety., Disp: 20 tablet, Rfl: 0 Medication Side Effects: sexual dysfunction since starting the Celexa,   Family Medical/ Social History: Changes?   MENTAL HEALTH EXAM:  There were no vitals taken for this visit.There is no height or weight on file to calculate BMI.  General Appearance: Casual, Neat and Well Groomed  Eye Contact:  Good  Speech:  Clear and Coherent  Volume:  Normal  Mood:  Anxious  Affect:  Congruent  Thought Process:  Goal Directed and Descriptions of Associations: Circumstantial  Orientation:  Full (Time, Place, and Person)  Thought Content: Logical   Suicidal Thoughts:  No  Homicidal Thoughts:  No  Memory:  WNL  Judgement:  Good  Insight:  Good  Psychomotor Activity:  Normal  Concentration:  Concentration: Good and Attention Span: Good  Recall:  Good  Fund of Knowledge: Good  Language: Good  Assets:  Desire for Improvement  ADL's:  Intact  Cognition: WNL  Prognosis:  Good    DIAGNOSES:    ICD-10-CM   1. Mixed obsessional thoughts and acts  F42.2     2. Generalized anxiety disorder  F41.1     3. Major depressive disorder, recurrent episode, moderate ([place])  F33.1       Receiving  Psychotherapy: No  Steffanie Rainwater in the past.  RECOMMENDATIONS: PDMP was reviewed.  Last Valium filled 02/04/2021. I provided 20 minutes of face to face time during this encounter, including time spent before and after the visit in records review, medical decision making, counseling pertinent to today's visit, and charting.   We discussed the OCD.  Best options right now include increasing the Celexa or changing Celexa to Luvox.  She has tolerated the Celexa well and is on the lowest dose so I recommend increasing that, that is her preference as well.  Increase Celexa to 20 mg, 1 p.o. daily.  (She just got a supply of 10 mg filled so will take 2 of those until the supply runs out.) Continue Valium 5 mg, 1/2-1 p.o. every 6 hours as needed anxiety.  She rarely takes.   Return in 4 to 6 weeks.  Donnal Moat, PA-C

## 2022-03-03 ENCOUNTER — Ambulatory Visit (INDEPENDENT_AMBULATORY_CARE_PROVIDER_SITE_OTHER): Payer: 59 | Admitting: Physician Assistant

## 2022-03-03 ENCOUNTER — Encounter: Payer: Self-pay | Admitting: Physician Assistant

## 2022-03-03 DIAGNOSIS — F411 Generalized anxiety disorder: Secondary | ICD-10-CM | POA: Diagnosis not present

## 2022-03-03 DIAGNOSIS — F422 Mixed obsessional thoughts and acts: Secondary | ICD-10-CM | POA: Diagnosis not present

## 2022-03-03 DIAGNOSIS — F325 Major depressive disorder, single episode, in full remission: Secondary | ICD-10-CM

## 2022-03-03 MED ORDER — CITALOPRAM HYDROBROMIDE 20 MG PO TABS: 20.0000 mg | ORAL_TABLET | Freq: Every day | ORAL | 1 refills | Status: DC

## 2022-03-03 NOTE — Progress Notes (Unsigned)
[facility]  Patient ID: Laura Lindsey,  MRN: [id number]  PCP: Jarrett Soho, PA-C  Date of Evaluation: 03/03/2022 Time spent:20 minutes  Chief Complaint:  Chief Complaint   Depression; Follow-up    HISTORY/CURRENT STATUS: HPI For routine follow-up   Six weeks ago, Celexa was increased for OCD sx. Is much better. Obsessions aren't as common. Things in the home or at work don't have to be "perfect" or she obsesses about them like she did. Not cleaning her house daily either. She was hyper about everything needing to be spotless or she couldn't stop thinking about it. Cleans regularly but not daily. The anxiety that was causing has pretty much gone away. No PA.   Patient is able to enjoy things.  Energy and motivation are good.  Work is going well.   No extreme sadness, tearfulness, or feelings of hopelessness.  Sleeps well most of the time. Personal hygiene is normal.   Denies any changes in concentration, making decisions, or remembering things.  Appetite has not changed.  Weight is stable.  Denies suicidal or homicidal thoughts.  Patient denies increased energy with decreased need for sleep, increased talkativeness, racing thoughts, impulsivity or risky behaviors, increased spending, increased libido, grandiosity, increased irritability or anger, paranoia, or hallucinations.  Denies dizziness, syncope, seizures, numbness, tingling, tremor, tics, unsteady gait, slurred speech, confusion. Denies muscle or joint pain, stiffness, or dystonia.  Individual Medical History/ Review of Systems: Changes? :No     Past medications for mental health diagnoses include: Valium for muscle spasms in the past,  Atarax, Zoloft, Cymbalta, Wellbutrin  Allergies: Robaxin [methocarbamol]  Current Medications:  Current Outpatient Medications:    diazepam (VALIUM) 5 MG tablet, Take 0.5-1 tablets (2.5-5 mg total) by mouth every 6 (six) hours as needed for anxiety., Disp: 20 tablet, Rfl: 0    norethindrone-ethinyl estradiol (JUNEL FE,GILDESS FE,LOESTRIN FE) 1-20 MG-MCG tablet, Take 1 tablet by mouth daily., Disp: , Rfl:    citalopram (CELEXA) 20 MG tablet, Take 1 tablet (20 mg total) by mouth daily., Disp: 90 tablet, Rfl: 1 Medication Side Effects: sexual dysfunction since starting the Celexa, tolerable  Family Medical/ Social History: Changes? No  MENTAL HEALTH EXAM:  There were no vitals taken for this visit.There is no height or weight on file to calculate BMI.  General Appearance: Casual, Neat and Well Groomed  Eye Contact:  Good  Speech:  Clear and Coherent  Volume:  Normal  Mood:  Euthymic  Affect:  Congruent  Thought Process:  Goal Directed and Descriptions of Associations: Circumstantial  Orientation:  Full (Time, Place, and Person)  Thought Content: Logical   Suicidal Thoughts:  No  Homicidal Thoughts:  No  Memory:  WNL  Judgement:  Good  Insight:  Good  Psychomotor Activity:  Normal  Concentration:  Concentration: Good and Attention Span: Good  Recall:  Good  Fund of Knowledge: Good  Language: Good  Assets:  Desire for Improvement  ADL's:  Intact  Cognition: WNL  Prognosis:  Good   DIAGNOSES:    ICD-10-CM   1. Mixed obsessional thoughts and acts  F42.2     2. Generalized anxiety disorder  F41.1     3. Major depressive disorder with single episode, in full remission (HCC)  F32.5      Receiving Psychotherapy: No  Danae Orleans in the past.  RECOMMENDATIONS: PDMP was reviewed.  Last Valium filled 01/20/2022. I provided 20 minutes of face to face time during this encounter, including time spent before and after  the visit in records review, medical decision making, counseling pertinent to today's visit, and charting.   She is doing well so no change needed.   Continue Celexa 20 mg, 1 p.o. daily. Continue Valium 5 mg, 1/2-1 p.o. every 6 hours as needed anxiety.  She rarely takes.   Return in 6 months.  Donnal Moat, PA-C

## 2022-04-10 ENCOUNTER — Other Ambulatory Visit: Payer: Self-pay | Admitting: Physician Assistant

## 2022-06-07 IMAGING — CR DG LUMBAR SPINE COMPLETE 4+V
5 series · 5 of 5 positions shown · non-contrast
Comparison: None.

CLINICAL DATA: Low back pain for 2 weeks with bilateral sciatica,
initial encounter

EXAM:
LUMBAR SPINE - COMPLETE 4+ VIEW

[t lumbar spine ap]
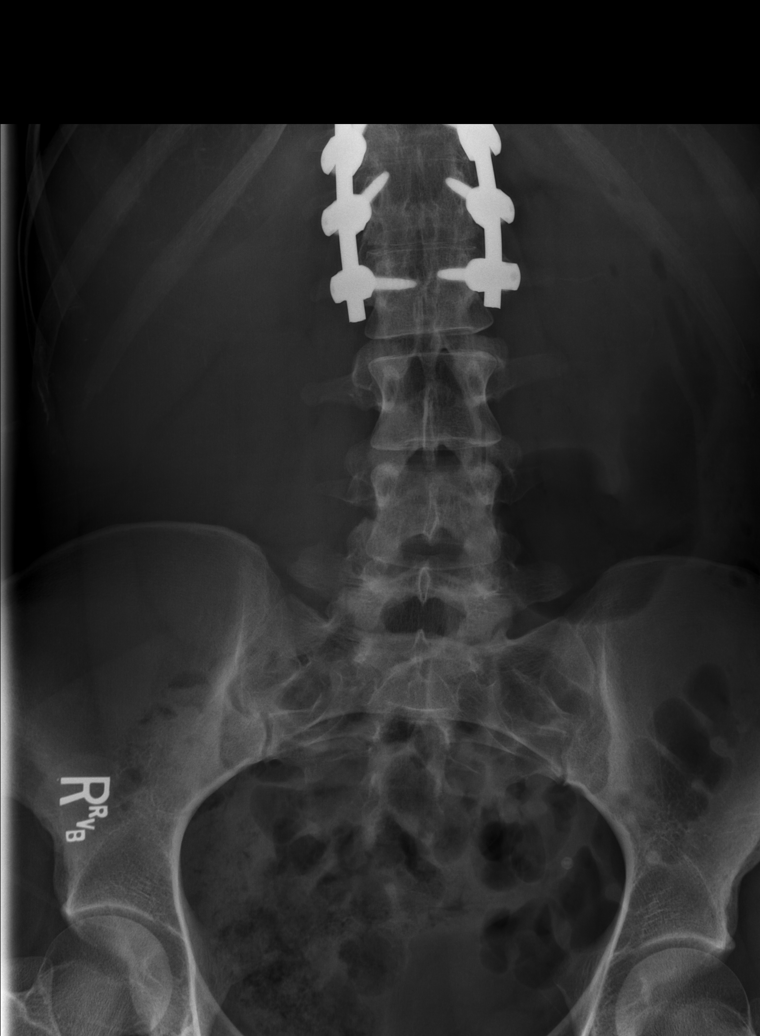

[t lumbar spine obl (1 of 2)]
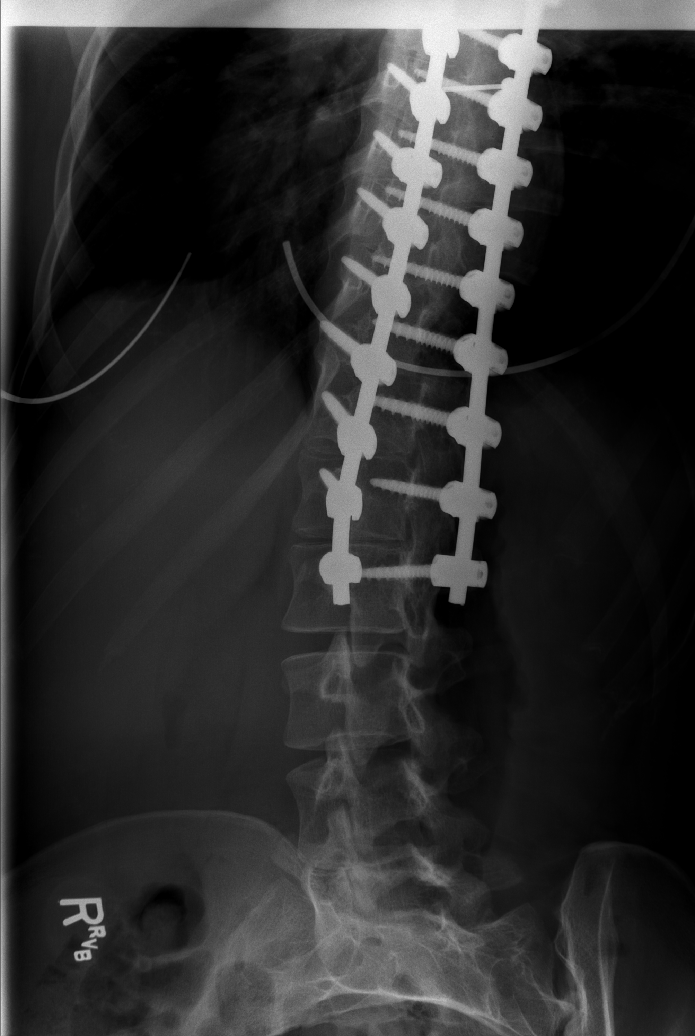

[t lumbar spine obl (2 of 2)]
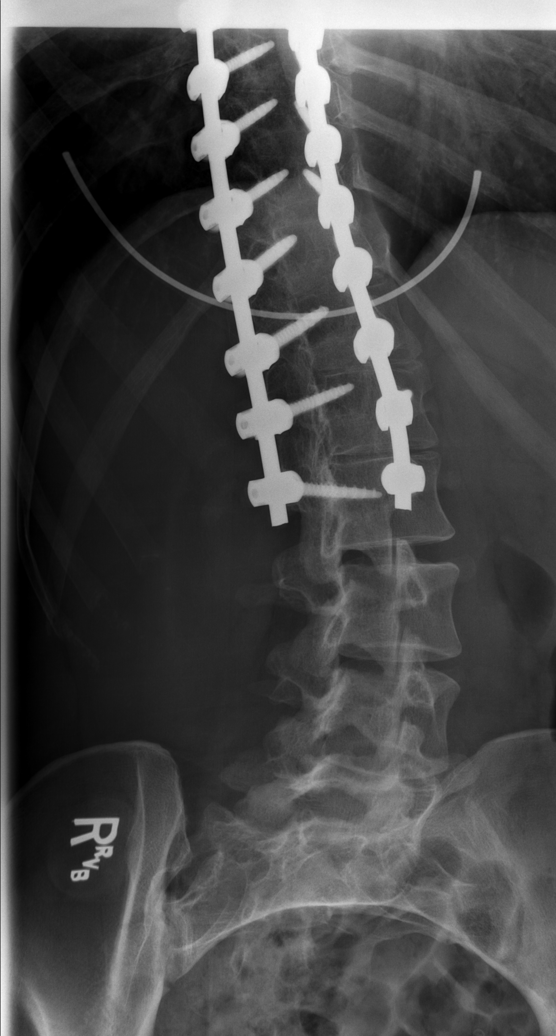

[t lumbar spine lat]
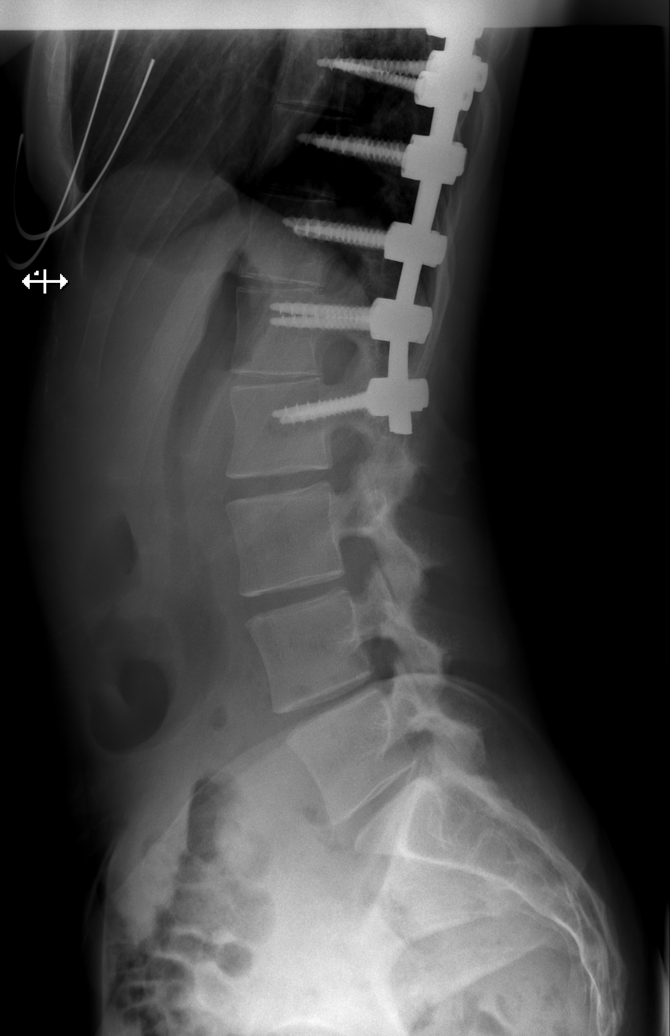

[t lumbar l-5 s-1 spot]
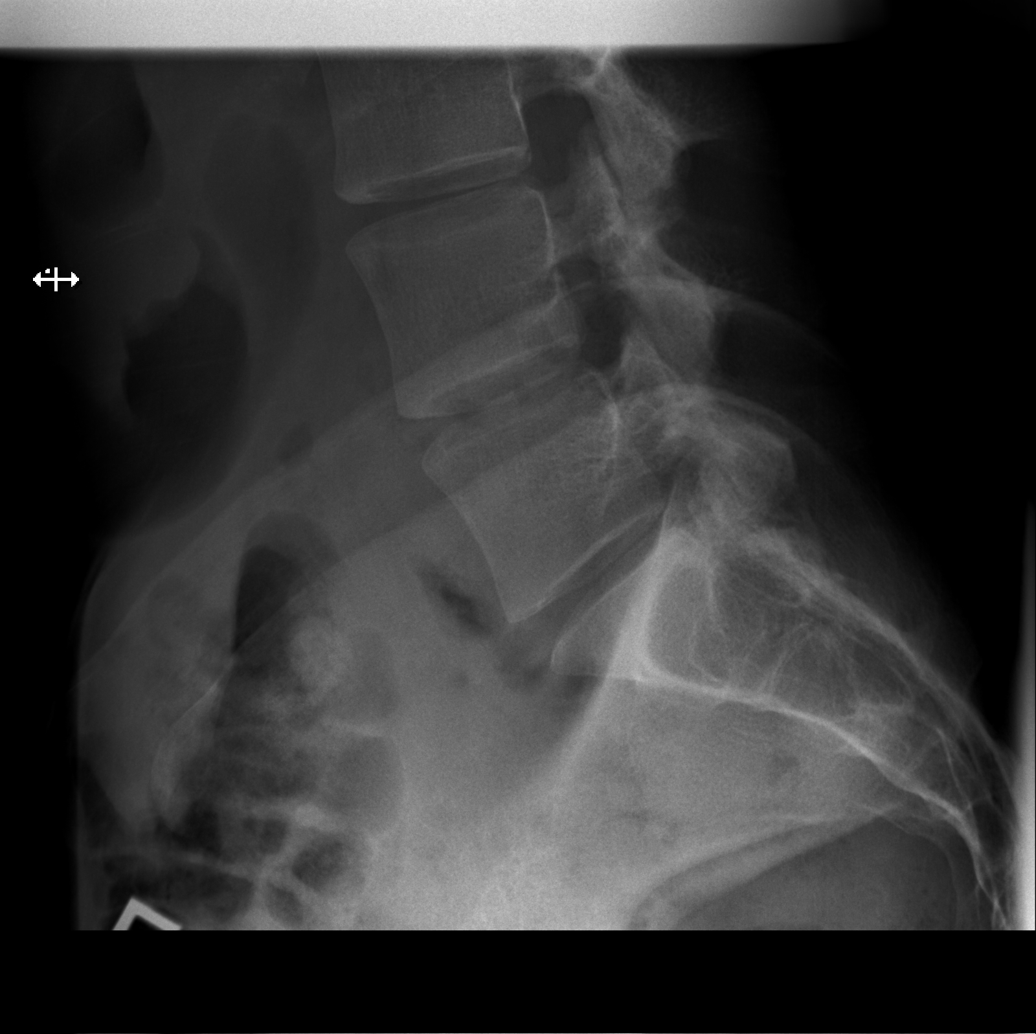

[5 of 5 positions shown; findings below may reference images not displayed]

FINDINGS: Postsurgical changes are noted at the thoracolumbar junction
extending to L2. Vertebral body height is well maintained. No pars
defects are noted. Disc space narrowing is noted from L3-S1. No soft
tissue abnormality is noted.
IMPRESSION: Postsurgical changes in the thoracolumbar spine. No acute
abnormality is noted.

## 2022-06-08 ENCOUNTER — Ambulatory Visit (INDEPENDENT_AMBULATORY_CARE_PROVIDER_SITE_OTHER): Payer: 59 | Admitting: Physician Assistant

## 2022-06-08 ENCOUNTER — Encounter: Payer: Self-pay | Admitting: Physician Assistant

## 2022-06-08 DIAGNOSIS — F411 Generalized anxiety disorder: Secondary | ICD-10-CM | POA: Diagnosis not present

## 2022-06-08 DIAGNOSIS — F4320 Adjustment disorder, unspecified: Secondary | ICD-10-CM

## 2022-06-08 DIAGNOSIS — F4323 Adjustment disorder with mixed anxiety and depressed mood: Secondary | ICD-10-CM

## 2022-06-08 DIAGNOSIS — F422 Mixed obsessional thoughts and acts: Secondary | ICD-10-CM | POA: Diagnosis not present

## 2022-06-08 MED ORDER — BUSPIRONE HCL 15 MG PO TABS: ORAL_TABLET | ORAL | 1 refills | Status: DC

## 2022-06-08 MED ORDER — CITALOPRAM HYDROBROMIDE 20 MG PO TABS: 30.0000 mg | ORAL_TABLET | Freq: Every day | ORAL | 1 refills | Status: DC

## 2022-06-08 NOTE — Progress Notes (Signed)
[facility]  Patient ID: Eislee Ligman,  MRN: [id number]  PCP: Marda Stalker, PA-C  Date of Evaluation: 06/08/2022 Time spent:20 minutes  Chief Complaint:  Chief Complaint   Anxiety    HISTORY/CURRENT STATUS: HPI Not doing well.   Has been much more anxious lately, assoc w/ PA, which she hasn't had in awhile. Her Mom, who has ST IV Breast CA, has decided to stop treatments b/c no longer working. Jadan agrees w/ her decision, but it's hard. Really bad PA a few days ago, couldn't get her breath, got sweaty, heart racing. Lasted 'awhile.' Finally went away on its own. Has Valium, but doesn't take it often. Even forgets she has it. But has taken it a few times since this incident and it's helpful.   Is sad but doesn't feel depressed, knows what's she feels is normal grief, knowing what's to come.  Energy and motivation are good.  Work is going well.  Sleeps well most of the time. ADLs and personal hygiene are normal.   Denies any changes in concentration, making decisions, or remembering things.  Appetite has not changed.  Weight is stable.   Denies suicidal or homicidal thoughts.  Denies dizziness, syncope, seizures, numbness, tingling, tremor, tics, unsteady gait, slurred speech, confusion. Denies muscle or joint pain, stiffness, or dystonia.  Individual Medical History/ Review of Systems: Changes? :No     Past medications for mental health diagnoses include: Valium for muscle spasms in the past,  Atarax, Zoloft, Cymbalta, Wellbutrin, Buspar, Celexa  Allergies: Robaxin [methocarbamol]  Current Medications:  Current Outpatient Medications:    diazepam (VALIUM) 5 MG tablet, Take 0.5-1 tablets (2.5-5 mg total) by mouth every 6 (six) hours as needed for anxiety., Disp: 20 tablet, Rfl: 0   norethindrone-ethinyl estradiol (JUNEL FE,GILDESS FE,LOESTRIN FE) 1-20 MG-MCG tablet, Take 1 tablet by mouth daily., Disp: , Rfl:    busPIRone (BUSPAR) 15 MG tablet, 1/3 po bid for 1 week,  then 2/3 bid for 1 week, then 1 pill bid., Disp: 60 tablet, Rfl: 1   citalopram (CELEXA) 20 MG tablet, Take 1.5 tablets (30 mg total) by mouth daily., Disp: 135 tablet, Rfl: 1 Medication Side Effects: sexual dysfunction since starting the Celexa, tolerable  Family Medical/ Social History: Changes? See HPI  MENTAL HEALTH EXAM:  There were no vitals taken for this visit.There is no height or weight on file to calculate BMI.  General Appearance: Casual, Neat and Well Groomed  Eye Contact:  Good  Speech:  Clear and Coherent  Volume:  Normal  Mood:   sad  Affect:  Congruent  Thought Process:  Goal Directed and Descriptions of Associations: Circumstantial  Orientation:  Full (Time, Place, and Person)  Thought Content: Logical   Suicidal Thoughts:  No  Homicidal Thoughts:  No  Memory:  WNL  Judgement:  Good  Insight:  Good  Psychomotor Activity:  Normal  Concentration:  Concentration: Good and Attention Span: Good  Recall:  Good  Fund of Knowledge: Good  Language: Good  Assets:  Desire for Improvement  ADL's:  Intact  Cognition: WNL  Prognosis:  Good   DIAGNOSES:    ICD-10-CM   1. 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 Mixed obsessional thoughts and acts  F42.2     3. Generalized anxiety disorder  F41.1     4. Anticipatory grief  F43.20      Receiving Psychotherapy: No  Steffanie Rainwater in the past.  RECOMMENDATIONS: PDMP was reviewed.  Last Valium filled 01/20/2022. I provided 20 minutes of face to face time during this encounter, including time spent before and after the visit in records review, medical decision making, counseling pertinent to today's visit, and charting.   I'm sorry that her Mom has had to make the decision about stopping treatment for the breast cancer. Encouraged pt to use the valium more often prn. Also recommend getting back on the Buspar. It was effective in the past.   Restart  BuSpar 15 mg 1/3 tablet twice daily for 1 week,  then increase to 2/3 tablet twice daily for 1 week, then increase to 1 tablet twice daily for anxiety. Increase Celexa 20 mg, to 1.5 pills p.o. daily. Continue Valium 5 mg, 1/2-1 p.o. every 6 hours as needed anxiety.  She rarely takes, but encouraged her to do so more often, esp under the circumstances.  Recommend counseling  Return in 5-6 weeks.   Donnal Moat, PA-C

## 2022-06-26 ENCOUNTER — Ambulatory Visit: Payer: 59 | Admitting: Physician Assistant

## 2022-07-01 ENCOUNTER — Other Ambulatory Visit: Payer: Self-pay | Admitting: Physician Assistant

## 2022-07-20 ENCOUNTER — Encounter: Payer: Self-pay | Admitting: Physician Assistant

## 2022-07-20 ENCOUNTER — Ambulatory Visit (INDEPENDENT_AMBULATORY_CARE_PROVIDER_SITE_OTHER): Payer: 59 | Admitting: Physician Assistant

## 2022-07-20 DIAGNOSIS — F3341 Major depressive disorder, recurrent, in partial remission: Secondary | ICD-10-CM

## 2022-07-20 DIAGNOSIS — F411 Generalized anxiety disorder: Secondary | ICD-10-CM | POA: Diagnosis not present

## 2022-07-20 DIAGNOSIS — F4320 Adjustment disorder, unspecified: Secondary | ICD-10-CM

## 2022-07-20 DIAGNOSIS — F422 Mixed obsessional thoughts and acts: Secondary | ICD-10-CM

## 2022-07-20 MED ORDER — DIAZEPAM 5 MG PO TABS: 2.5000 mg | ORAL_TABLET | Freq: Four times a day (QID) | ORAL | 1 refills | Status: DC | PRN

## 2022-07-20 MED ORDER — BUSPIRONE HCL 15 MG PO TABS: 15.0000 mg | ORAL_TABLET | Freq: Two times a day (BID) | ORAL | 1 refills | Status: DC

## 2022-07-20 NOTE — Progress Notes (Signed)
[facility]  Patient ID: Laura Lindsey,  MRN: [id number]  PCP: Marda Stalker, PA-C  Date of Evaluation: 07/20/2022 Time spent:20 minutes  Chief Complaint:  Chief Complaint   Anxiety; Depression; Follow-up    HISTORY/CURRENT STATUS: HPI For 6 week med check  Restarted BuSpar and increased Celexa 6 weeks ago.  She is feeling quite a bit better.  She still gets anxious but not having panic attacks like she did.  She has been taking the Valium more often as well.   Her mom who has St IV breast cancer and decided not to take any more treatments, is doing pretty well. Rebekha is going to see her April 20th, in [place], looking forward to that.   Pt's brother was admitted to a mental health hospital for 2 weeks since our last visit.  He has a history of drug abuse, he was in the [employer] for 8 years and even though he did not fight on the front line, he saw a lot of things and has PTSD.  He is out of the hospital now and is doing better.  That caused a lot of anxiety for her.  Energy and motivation are good.  Work is going well.   No extreme sadness, tearfulness, or feelings of hopelessness.  Sleeps well most of the time. ADLs and personal hygiene are normal.   Denies any changes in concentration, making decisions, or remembering things.  Appetite has not changed.  Weight is stable.   Denies suicidal or homicidal thoughts.  Patient denies increased energy with decreased need for sleep, increased talkativeness, racing thoughts, impulsivity or risky behaviors, increased spending, increased libido, grandiosity, increased irritability or anger, paranoia, or hallucinations.  Denies dizziness, syncope, seizures, numbness, tingling, tremor, tics, unsteady gait, slurred speech, confusion. Denies muscle or joint pain, stiffness, or dystonia.  Individual Medical History/ Review of Systems: Changes? :No     Past medications for mental health diagnoses include: Valium for muscle spasms in the  past,  Atarax, Zoloft, Cymbalta, Wellbutrin, Buspar, Celexa  Allergies: Robaxin [methocarbamol]  Current Medications:  Current Outpatient Medications:    citalopram (CELEXA) 20 MG tablet, Take 1.5 tablets (30 mg total) by mouth daily., Disp: 135 tablet, Rfl: 1   norethindrone-ethinyl estradiol (JUNEL FE,GILDESS FE,LOESTRIN FE) 1-20 MG-MCG tablet, Take 1 tablet by mouth daily., Disp: , Rfl:    busPIRone (BUSPAR) 15 MG tablet, Take 1 tablet (15 mg total) by mouth 2 (two) times daily., Disp: 180 tablet, Rfl: 1   diazepam (VALIUM) 5 MG tablet, Take 0.5-1 tablets (2.5-5 mg total) by mouth every 6 (six) hours as needed for anxiety., Disp: 30 tablet, Rfl: 1 Medication Side Effects: sexual dysfunction since starting the Celexa, tolerable  Family Medical/ Social History: Changes? See HPI  MENTAL HEALTH EXAM:  There were no vitals taken for this visit.There is no height or weight on file to calculate BMI.  General Appearance: Casual, Neat and Well Groomed  Eye Contact:  Good  Speech:  Clear and Coherent  Volume:  Normal  Mood:  Euthymic  Affect:  Congruent  Thought Process:  Goal Directed and Descriptions of Associations: Circumstantial  Orientation:  Full (Time, Place, and Person)  Thought Content: Logical   Suicidal Thoughts:  No  Homicidal Thoughts:  No  Memory:  WNL  Judgement:  Good  Insight:  Good  Psychomotor Activity:  Normal  Concentration:  Concentration: Good and Attention Span: Good  Recall:  Good  Fund of Knowledge: Good  Language: Good  Assets:  Desire for Improvement  ADL's:  Intact  Cognition: WNL  Prognosis:  Good   DIAGNOSES:    ICD-10-CM   1. Mixed obsessional thoughts and acts  F42.2     2. Generalized anxiety disorder  F41.1     3. Anticipatory grief  F43.20     4. Recurrent major depressive disorder, in partial remission ([place])  F33.41       Receiving Psychotherapy: No  Steffanie Rainwater in the past.  RECOMMENDATIONS: PDMP was reviewed.  Last Valium  filled 01/20/2022. I provided 20 minutes of face to face time during this encounter, including time spent before and after the visit in records review, medical decision making, counseling pertinent to today's visit, and charting.   I am glad she is doing better.  Under the circumstances, I am glad she is using the Valium more often.  Continue BuSpar 15 mg, 1 p.o. twice daily.   Continue Celexa 20 mg, 1.5 pills p.o. daily. Continue Valium 5 mg, 1/2-1 p.o. every 6 hours as needed anxiety.  Recommend counseling  Return in 6 weeks.   Donnal Moat, PA-C

## 2022-07-31 ENCOUNTER — Other Ambulatory Visit: Payer: Self-pay | Admitting: Physician Assistant

## 2022-08-02 NOTE — Telephone Encounter (Signed)
Last Rx was for 90-day supply with a RF. ? When she filled last.

## 2022-08-02 NOTE — Telephone Encounter (Signed)
Laura Lindsey called at 11:40 to request refill of her citalopram.  She said the pharmacy told her it was to early to refill and won't fill it, but she says she is out.  The dose was increased to 1.5 so she thinks that is why she is out early.  Please call pharmacy to approve refill.  Appt 5/10  [id number] - [place], [place] - [address]

## 2022-08-03 NOTE — Telephone Encounter (Signed)
LVM to RC 

## 2022-09-01 ENCOUNTER — Ambulatory Visit (INDEPENDENT_AMBULATORY_CARE_PROVIDER_SITE_OTHER): Payer: 59 | Admitting: Physician Assistant

## 2022-09-01 ENCOUNTER — Encounter: Payer: Self-pay | Admitting: Physician Assistant

## 2022-09-01 ENCOUNTER — Ambulatory Visit: Payer: 59 | Admitting: Physician Assistant

## 2022-09-01 DIAGNOSIS — F422 Mixed obsessional thoughts and acts: Secondary | ICD-10-CM

## 2022-09-01 DIAGNOSIS — F411 Generalized anxiety disorder: Secondary | ICD-10-CM

## 2022-09-01 DIAGNOSIS — F3341 Major depressive disorder, recurrent, in partial remission: Secondary | ICD-10-CM

## 2022-09-01 NOTE — Progress Notes (Signed)
[facility]  Patient ID: Laura Lindsey,  MRN: [id number]  PCP: Jarrett Soho, PA-C  Date of Evaluation: 09/01/2022 Time spent:20 minutes  Chief Complaint:  Chief Complaint   Depression; Anxiety; Follow-up    HISTORY/CURRENT STATUS: HPI For routine med check.   Since LOV, she was able to go to [place] to visit her Mom who has terminal breast cancer. Her Mom is doing well, she's elected for no further treatment.  They had a good visit.   Sofina is doing well. Since going back on Buspar, increasing the Celexa several months ago, and taking Valium occas when really needs it, she's better with the depression and anxiety.  No PA but does still get overwhelmed at times.  Patient is able to enjoy things.  Energy and motivation are good.  Work is going well.   No extreme sadness, tearfulness, or feelings of hopelessness.  Sleeps well most of the time. ADLs and personal hygiene are normal.   Denies any changes in concentration, making decisions, or remembering things.  Appetite has not changed.  Weight is stable.  Denies suicidal or homicidal thoughts.  Patient denies increased energy with decreased need for sleep, increased talkativeness, racing thoughts, impulsivity or risky behaviors, increased spending, increased libido, grandiosity, increased irritability or anger, paranoia, or hallucinations.  Denies dizziness, syncope, seizures, numbness, tingling, tremor, tics, unsteady gait, slurred speech, confusion. Denies muscle or joint pain, stiffness, or dystonia.  Individual Medical History/ Review of Systems: Changes? :No     Past medications for mental health diagnoses include: Valium for muscle spasms in the past,  Atarax, Zoloft, Cymbalta, Wellbutrin, Buspar, Celexa  Allergies: Robaxin [methocarbamol]  Current Medications:  Current Outpatient Medications:    busPIRone (BUSPAR) 15 MG tablet, Take 1 tablet (15 mg total) by mouth 2 (two) times daily., Disp: 180 tablet, Rfl: 1    citalopram (CELEXA) 20 MG tablet, Take 1.5 tablets (30 mg total) by mouth daily., Disp: 135 tablet, Rfl: 1   diazepam (VALIUM) 5 MG tablet, Take 0.5-1 tablets (2.5-5 mg total) by mouth every 6 (six) hours as needed for anxiety., Disp: 30 tablet, Rfl: 1   norethindrone-ethinyl estradiol (JUNEL FE,GILDESS FE,LOESTRIN FE) 1-20 MG-MCG tablet, Take 1 tablet by mouth daily., Disp: , Rfl:  Medication Side Effects: sexual dysfunction since starting the Celexa, tolerable  Family Medical/ Social History: Changes? See HPI  MENTAL HEALTH EXAM:  There were no vitals taken for this visit.There is no height or weight on file to calculate BMI.  General Appearance: Casual, Neat and Well Groomed  Eye Contact:  Good  Speech:  Clear and Coherent  Volume:  Normal  Mood:  Euthymic  Affect:  Congruent  Thought Process:  Goal Directed and Descriptions of Associations: Circumstantial  Orientation:  Full (Time, Place, and Person)  Thought Content: Logical   Suicidal Thoughts:  No  Homicidal Thoughts:  No  Memory:  WNL  Judgement:  Good  Insight:  Good  Psychomotor Activity:  Normal  Concentration:  Concentration: Good and Attention Span: Good  Recall:  Good  Fund of Knowledge: Good  Language: Good  Assets:  Desire for Improvement  ADL's:  Intact  Cognition: WNL  Prognosis:  Good   DIAGNOSES:    ICD-10-CM   1. Mixed obsessional thoughts and acts  F42.2     2. Recurrent major depressive disorder, in partial remission (HCC)  F33.41     3. Generalized anxiety disorder  F41.1       Receiving Psychotherapy: No  Danae Orleans  in the past.  RECOMMENDATIONS: PDMP was reviewed.  Last Valium filled 07/20/2022. I provided 20 minutes of face to face time during this encounter, including time spent before and after the visit in records review, medical decision making, counseling pertinent to today's visit, and charting.   She's doing much better now so no changes needed.   Continue BuSpar 15 mg, 1 p.o.  twice daily.   Continue Celexa 20 mg, 1.5 pills p.o. daily. Continue Valium 5 mg, 1/2-1 p.o. every 6 hours as needed anxiety.  Recommend counseling  Return in 4 months.   Melony Overly, PA-C

## 2023-01-05 ENCOUNTER — Encounter: Payer: Self-pay | Admitting: Physician Assistant

## 2023-01-05 ENCOUNTER — Ambulatory Visit (INDEPENDENT_AMBULATORY_CARE_PROVIDER_SITE_OTHER): Payer: 59 | Admitting: Physician Assistant

## 2023-01-05 DIAGNOSIS — F4321 Adjustment disorder with depressed mood: Secondary | ICD-10-CM | POA: Diagnosis not present

## 2023-01-05 DIAGNOSIS — F4323 Adjustment disorder with mixed anxiety and depressed mood: Secondary | ICD-10-CM | POA: Diagnosis not present

## 2023-01-05 MED ORDER — BUSPIRONE HCL 15 MG PO TABS: 15.0000 mg | ORAL_TABLET | Freq: Two times a day (BID) | ORAL | 1 refills | Status: DC

## 2023-01-05 MED ORDER — DIAZEPAM 5 MG PO TABS: 2.5000 mg | ORAL_TABLET | Freq: Four times a day (QID) | ORAL | 1 refills | Status: DC | PRN

## 2023-01-05 MED ORDER — CITALOPRAM HYDROBROMIDE 40 MG PO TABS
40.0000 mg | ORAL_TABLET | Freq: Every day | ORAL | 1 refills | Status: DC
Start: 2023-01-05 — End: 2023-05-28

## 2023-01-05 NOTE — Progress Notes (Unsigned)
[facility]  Patient ID: Laura Lindsey,  MRN: [id number]  PCP: Jarrett Soho, PA-C  Date of Evaluation: 01/05/2023 Time spent:20 minutes  Chief Complaint:  Chief Complaint   Follow-up    HISTORY/CURRENT STATUS: HPI For routine med check.   Her mom passed away in 2022/11/10 of metastatic breast cancer.  She had been on hospice for several months so it was expected but it seemed to happen quickly once her health started to decline.  Laura Lindsey was able to see her a few weeks prior to her death and she seemed to be doing very well.  Then a few weeks later she got the call to go back to [place], that her condition had declined.  She was able to be there with her mom when she passed away.  Even though it is hard and she misses her, she is glad for that experience of being with her mom when she passed.  She passed very peacefully and was not in any pain.  Laura Lindsey has times where the grief will hit her out of nowhere and she will start crying but she feels like that is normal.  She has needed the Valium more in the past few months than she has in a long time.  She will feel a hollowness in her chest and gets overwhelmed.  Not having panic attacks though.  She asks if the Celexa comes in a different dose so that she would not have to take 1.5 pills of the Celexa 20 mg.  She got engaged over the summer!  Is very excited about that and will be marrying in October 2025.  She has been dating this guy for several years and is very happy.  Energy and motivation are good.  Work is going well.   No feelings of hopelessness.  Sleeps well most of the time. ADLs and personal hygiene are normal.   Denies any changes in concentration, making decisions, or remembering things.  Appetite has not changed.  Weight is stable.  Denies laxative use, calorie restricting, or binging and purging.   Denies cutting or any form of self-harm.  Denies suicidal or homicidal thoughts.  Denies dizziness, syncope, seizures,  numbness, tingling, tremor, tics, unsteady gait, slurred speech, confusion. Denies muscle or joint pain, stiffness, or dystonia.  Individual Medical History/ Review of Systems: Changes? :No     Past medications for mental health diagnoses include: Valium for muscle spasms in the past,  Atarax, Zoloft, Cymbalta, Wellbutrin, Buspar, Celexa  Allergies: Robaxin [methocarbamol]  Current Medications:  Current Outpatient Medications:    citalopram (CELEXA) 40 MG tablet, Take 1 tablet (40 mg total) by mouth daily., Disp: 90 tablet, Rfl: 1   norethindrone-ethinyl estradiol (JUNEL FE,GILDESS FE,LOESTRIN FE) 1-20 MG-MCG tablet, Take 1 tablet by mouth daily., Disp: , Rfl:    busPIRone (BUSPAR) 15 MG tablet, Take 1 tablet (15 mg total) by mouth 2 (two) times daily., Disp: 180 tablet, Rfl: 1   diazepam (VALIUM) 5 MG tablet, Take 0.5-1 tablets (2.5-5 mg total) by mouth every 6 (six) hours as needed for anxiety., Disp: 30 tablet, Rfl: 1 Medication Side Effects: sexual dysfunction since starting the Celexa, tolerable  Family Medical/ Social History: Changes? See HPI  MENTAL HEALTH EXAM:  There were no vitals taken for this visit.There is no height or weight on file to calculate BMI.  General Appearance: Casual, Neat and Well Groomed  Eye Contact:  Good  Speech:  Clear and Coherent and Normal Rate  Volume:  Normal  Mood:   Sad  Affect:  Congruent  Thought Process:  Goal Directed and Descriptions of Associations: Circumstantial  Orientation:  Full (Time, Place, and Person)  Thought Content: Logical   Suicidal Thoughts:  No  Homicidal Thoughts:  No  Memory:  WNL  Judgement:  Good  Insight:  Good  Psychomotor Activity:  Normal  Concentration:  Concentration: Good and Attention Span: Good  Recall:  Good  Fund of Knowledge: Good  Language: Good  Assets:  Desire for Improvement  ADL's:  Intact  Cognition: WNL  Prognosis:  Good   DIAGNOSES:    ICD-10-CM   1. Situational mixed anxiety and  depressive disorder  F43.23     2. Grief  F43.21      Receiving Psychotherapy: No  Danae Orleans in the past.  RECOMMENDATIONS: PDMP was reviewed.  Last Valium filled 07/20/2022. I provided 20 minutes of face to face time during this encounter, including time spent before and after the visit in records review, medical decision making, counseling pertinent to today's visit, and charting.   My condolences and the loss of her mom.  I recommend increasing the Celexa up to 40 mg.  Even though she is not extremely depressed, she is understandably sad and more anxious than she has been, I think a bump up in the dose would be beneficial.  The holidays are coming which may be very hard for her this year.  She understands and would like to increase it.  Continue BuSpar 15 mg, 1 p.o. twice daily.   Increase Celexa to 40 mg, 1 p.o. daily. Continue Valium 5 mg, 1/2-1 p.o. every 6 hours as needed anxiety.   Recommend that she get back in counseling with Danae Orleans. Return in 6 weeks.   Melony Overly, PA-C

## 2023-01-26 ENCOUNTER — Telehealth: Payer: Self-pay | Admitting: Genetic Counselor

## 2023-01-26 NOTE — Telephone Encounter (Signed)
Per IB Message on 01/19/23 I called the patient and scheduled appointment for Genetic Counseling and lab. Patient is aware of date and time.

## 2023-02-12 ENCOUNTER — Encounter: Payer: Self-pay | Admitting: Genetic Counselor

## 2023-02-12 ENCOUNTER — Other Ambulatory Visit: Payer: Self-pay | Admitting: Genetic Counselor

## 2023-02-12 DIAGNOSIS — Z803 Family history of malignant neoplasm of breast: Secondary | ICD-10-CM

## 2023-02-14 ENCOUNTER — Other Ambulatory Visit: Payer: Self-pay

## 2023-02-14 ENCOUNTER — Inpatient Hospital Stay: Payer: Managed Care, Other (non HMO) | Attending: Genetic Counselor | Admitting: Genetic Counselor

## 2023-02-14 ENCOUNTER — Inpatient Hospital Stay: Payer: Managed Care, Other (non HMO)

## 2023-02-14 ENCOUNTER — Encounter: Payer: Self-pay | Admitting: Genetic Counselor

## 2023-02-14 DIAGNOSIS — Z803 Family history of malignant neoplasm of breast: Secondary | ICD-10-CM | POA: Diagnosis not present

## 2023-02-14 DIAGNOSIS — Z1501 Genetic susceptibility to malignant neoplasm of breast: Secondary | ICD-10-CM | POA: Insufficient documentation

## 2023-02-14 NOTE — Progress Notes (Addendum)
REFERRING PROVIDER: Jarrett Soho, PA-C [address] [place],  [place] [address]  PRIMARY PROVIDER:  Jarrett Soho, PA-C  PRIMARY REASON FOR VISIT:  1. Family history of breast cancer      HISTORY OF PRESENT ILLNESS:   Laura Lindsey, a 22 y.o. female, was seen for a  cancer genetics consultation at the request of Jarrett Soho, PA-C due to a family history of breast cancer.  Laura Lindsey presents to clinic today to discuss the possibility of a hereditary predisposition to cancer, genetic testing, and to further clarify her future cancer risks, as well as potential cancer risks for family members.   Laura Lindsey is a 22 y.o. female with no personal history of cancer.    CANCER HISTORY:  Oncology History   No history exists.     RISK FACTORS:  Menarche was at age 58.  First live birth at age N/A.  OCP use for approximately  6-7  years.  Ovaries intact: yes.  Hysterectomy: no.  Menopausal status: premenopausal.  HRT use: 0 years. Colonoscopy: no; not examined. Mammogram within the last year: no. Number of breast biopsies: 0. Up to date with pelvic exams: yes. Any excessive radiation exposure in the past: no  Past Medical History:  Diagnosis Date   Family history of breast cancer     Past Surgical History:  Procedure Laterality Date   BACK SURGERY  07/18/2017   SPINAL FUSION  12/24/2012    Social History   Socioeconomic History   Marital status: Single    Spouse name: Not on file   Number of children: Not on file   Years of education: Not on file   Highest education level: Not on file  Occupational History   Not on file  Tobacco Use   Smoking status: Every Day    Types: E-cigarettes   Smokeless tobacco: Never  Substance and Sexual Activity   Alcohol use: Yes    Alcohol/week: 2.0 standard drinks of alcohol    Types: 2 Cans of beer per week   Drug use: Not Currently   Sexual activity: Yes  Other Topics Concern   Not on file  Social  History Narrative   Monday is a 9th grade student who is home-schooled; she does well in school. She lives with her parents. She enjoys Diplomatic Services operational officer and botany.   Social Determinants of Health   Financial Resource Strain: Not on file  Food Insecurity: Not on file  Transportation Needs: Not on file  Physical Activity: Not on file  Stress: Not on file  Social Connections: Not on file     FAMILY HISTORY:  We obtained a detailed, 4-generation family history.  Significant diagnoses are listed below: Family History  Problem Relation Age of Onset   Breast cancer Mother 47       d. 53    The patient has one brother who is cancer free.  Her father is living and her mother is deceased.  The patient's father is estranged from the family.  No additional information is known about him.  The patient's mother was diagnosed at 58 with breast cancer and died at 53.  She had five sisters and a brother who are cancer free.  Her mother had melanoma and her father had esophageal cancer.  The patient mother had a maternal cousin with breast cancer at 12.  Laura Lindsey is aware of previous family history of genetic testing for hereditary cancer risks. Patient's maternal ancestors are of [place]  descent, and paternal ancestors are of unknown descent. There is no reported Ashkenazi Jewish ancestry. There is no known consanguinity.  GENETIC COUNSELING ASSESSMENT: Laura Lindsey is a 22 y.o. female with a family history of breast cancer. We, therefore, discussed and recommended the following at today's visit.   DISCUSSION: We discussed that, in general, most cancer is not inherited in families, but instead is sporadic or familial. Sporadic cancers occur by chance and typically happen at older ages (>50 years) as this type of cancer is caused by genetic changes acquired during an individual's lifetime. Some families have more cancers than would be expected by chance; however, the ages or types of cancer are not  consistent with a known genetic mutation or known genetic mutations have been ruled out. This type of familial cancer is thought to be due to a combination of multiple genetic, environmental, hormonal, and lifestyle factors. While this combination of factors likely increases the risk of cancer, the exact source of this risk is not currently identifiable or testable.  We discussed that 5 - 10% of breast cancer is hereditary, with most cases associated with BRCA mutations.  There are other genes that can be associated with hereditary breast cancer syndromes.  These include ATM, CHEK2 and PALB2.  We discussed that testing is beneficial for several reasons including knowing how to follow individuals after completing their treatment, identifying whether potential treatment options such as PARP inhibitors would be beneficial, and understand if other family members could be at risk for cancer and allow them to undergo genetic testing.   We reviewed the characteristics, features and inheritance patterns of hereditary cancer syndromes. We also discussed genetic testing, including the appropriate family members to test, the process of testing, insurance coverage and turn-around-time for results. We discussed the implications of a negative, positive, carrier and/or variant of uncertain significant result (VUS).  VUS's are considered normal and medical management should not be changed based on a VUS result.  Laura Lindsey mother had genetic testing in 2019 using the CancerNext panel through [employer]  This test identified a MSH6 VUS, but was otherwise negative.  We discussed VUS results, how approximately 90% of them are determined to be benign results in the future, and that we do not offer medical management changes or genetic testing to family members based on a result with a VUS.  Therefore, we do not feel that Laura Lindsey would otherwise qualify for genetic testing. Based on her mother's negative testing, we  discussed with Laura Lindsey that the family history does not meet insurance or NCCN criteria for genetic testing and, therefore, is not highly consistent with a familial hereditary cancer syndrome.  We feel she is at low risk to harbor a gene mutation associated with such a condition. Thus, we did not recommend any genetic testing, at this time, and recommended Laura Lindsey continue to follow the cancer screening guidelines given by her primary healthcare provider.  Based on the patient's family history, a statistical model [employer] cabin crew) was used to estimate her risk of developing breast cancer. This estimates her lifetime risk of developing breast cancer to be approximately 26.7%. This estimation does not consider any genetic testing results.  The patient's lifetime breast cancer risk is a preliminary estimate based on available information using one of several models endorsed by the American Cancer Society (ACS). The ACS recommends consideration of breast MRI screening as an adjunct to mammography for patients at high risk (defined as 20% or greater lifetime risk). Please  note that a woman's breast cancer risk changes over time. It may increase or decrease based on age and any changes to the personal and/or family medical history. The risks and recommendations listed above apply to this patient at this point in time. In the future, she may or may not be eligible for the same medical management strategies and, in some cases, other medical management strategies may become available to her. If she is interested in an updated breast cancer risk assessment at a later date, she can contact us.  Laura Lindsey has been determined to be at high risk for breast cancer.  Therefore, we recommend that annual screening with mammography and breast MRI begin at age 32, or 10 years prior to the age of breast cancer diagnosis in a relative (whichever is earlier).  We discussed that Laura Lindsey should discuss her individual situation with  her referring physician and determine a breast cancer screening plan with which they are both comfortable.    PLAN: Based on her mother's genetic testing, Laura Lindsey did not wish to pursue genetic testing at today's visit. We understand this decision and remain available to coordinate genetic testing at any time in the future. We, therefore, recommend Laura Lindsey continue to follow the cancer screening guidelines given by her primary healthcare provider.  We do not recommend genetic testing at this time. Patient was determined to be at high risk for breast cancer.  She should start screening at age 59, or 37 years younger than the earliest age of onset, whichever is earliest, with breast MRI and mammogram.  Lastly, we encouraged Laura Lindsey to remain in contact with cancer genetics annually so that we can continuously update the family history and inform her of any changes in cancer genetics and testing that may be of benefit for this family.   Laura Lindsey questions were answered to her satisfaction today. Our contact information was provided should additional questions or concerns arise. Thank you for the referral and allowing [place] to share in the care of your patient.   Manuel Lawhead P. Lowell Guitar, MS, [facility] [place] Licensed, Patent attorney [email or web address] phone: [phone]  The patient was seen for a total of 30 minutes in face-to-face genetic counseling.  The patient was seen alone.  Drs. Meliton Rattan, and/or [place] were available for questions, if needed..    _______________________________________________________________________ For Office Staff:  Number of people involved in session: 1 Was an Intern/ student involved with case: yes

## 2023-02-20 ENCOUNTER — Emergency Department (HOSPITAL_BASED_OUTPATIENT_CLINIC_OR_DEPARTMENT_OTHER): Payer: Managed Care, Other (non HMO)

## 2023-02-20 ENCOUNTER — Other Ambulatory Visit: Payer: Self-pay

## 2023-02-20 ENCOUNTER — Emergency Department (HOSPITAL_BASED_OUTPATIENT_CLINIC_OR_DEPARTMENT_OTHER): Payer: Managed Care, Other (non HMO) | Admitting: Radiology

## 2023-02-20 ENCOUNTER — Emergency Department (HOSPITAL_BASED_OUTPATIENT_CLINIC_OR_DEPARTMENT_OTHER)
Admission: EM | Admit: 2023-02-20 | Discharge: 2023-02-21 | Disposition: A | Discharge status: Home / Self Care | Payer: Managed Care, Other (non HMO) | Attending: Emergency Medicine | Admitting: Emergency Medicine

## 2023-02-20 DIAGNOSIS — R1013 Epigastric pain: Secondary | ICD-10-CM | POA: Insufficient documentation

## 2023-02-20 DIAGNOSIS — R110 Nausea: Secondary | ICD-10-CM | POA: Diagnosis not present

## 2023-02-20 DIAGNOSIS — R17 Unspecified jaundice: Secondary | ICD-10-CM | POA: Diagnosis not present

## 2023-02-20 DIAGNOSIS — Z1501 Genetic susceptibility to malignant neoplasm of breast: Secondary | ICD-10-CM | POA: Diagnosis present

## 2023-02-20 DIAGNOSIS — Z853 Personal history of malignant neoplasm of breast: Secondary | ICD-10-CM | POA: Insufficient documentation

## 2023-02-20 DIAGNOSIS — D72829 Elevated white blood cell count, unspecified: Secondary | ICD-10-CM | POA: Insufficient documentation

## 2023-02-20 DIAGNOSIS — M545 Low back pain, unspecified: Secondary | ICD-10-CM | POA: Diagnosis not present

## 2023-02-20 LAB — LIPASE, BLOOD: Lipase: 32 U/L (ref 11–51)

## 2023-02-20 LAB — URINALYSIS, W/ REFLEX TO CULTURE (INFECTION SUSPECTED)
Bilirubin Urine: NEGATIVE
Glucose, UA: NEGATIVE mg/dL
Ketones, ur: 15 mg/dL — AB
Leukocytes,Ua: NEGATIVE
Nitrite: NEGATIVE
Protein, ur: NEGATIVE mg/dL
Specific Gravity, Urine: 1.015 (ref 1.005–1.030)
pH: 6.5 (ref 5.0–8.0)

## 2023-02-20 LAB — PREGNANCY, URINE: Preg Test, Ur: NEGATIVE

## 2023-02-20 LAB — HEPATIC FUNCTION PANEL
ALT: 22 U/L (ref 0–44)
AST: 20 U/L (ref 15–41)
Albumin: 4.4 g/dL (ref 3.5–5.0)
Alkaline Phosphatase: 28 U/L — ABNORMAL LOW (ref 38–126)
Bilirubin, Direct: 0.3 mg/dL — ABNORMAL HIGH (ref 0.0–0.2)
Indirect Bilirubin: 1 mg/dL — ABNORMAL HIGH (ref 0.3–0.9)
Total Bilirubin: 1.3 mg/dL — ABNORMAL HIGH (ref 0.3–1.2)
Total Protein: 7.3 g/dL (ref 6.5–8.1)

## 2023-02-20 LAB — BASIC METABOLIC PANEL
Anion gap: 8 (ref 5–15)
BUN: 15 mg/dL (ref 6–20)
CO2: 25 mmol/L (ref 22–32)
Calcium: 9.6 mg/dL (ref 8.9–10.3)
Chloride: 102 mmol/L (ref 98–111)
Creatinine, Ser: 0.67 mg/dL (ref 0.44–1.00)
GFR, Estimated: >60 mL/min (ref >60)
Glucose, Bld: 87 mg/dL (ref 70–99)
Potassium: 3.8 mmol/L (ref 3.5–5.1)
Sodium: 135 mmol/L (ref 135–145)

## 2023-02-20 LAB — CBC
HCT: 40.9 % (ref 36.0–46.0)
Hemoglobin: 13.9 g/dL (ref 12.0–15.0)
MCH: 30.4 pg (ref 26.0–34.0)
MCHC: 34 g/dL (ref 30.0–36.0)
MCV: 89.5 fL (ref 80.0–100.0)
Platelets: 379 10*3/uL (ref 150–400)
RBC: 4.57 MIL/uL (ref 3.87–5.11)
RDW: 11.6 % (ref 11.5–15.5)
WBC: 10.7 10*3/uL — ABNORMAL HIGH (ref 4.0–10.5)
nRBC: 0 % (ref 0.0–0.2)

## 2023-02-20 LAB — TROPONIN I (HIGH SENSITIVITY): Troponin I (High Sensitivity): <2 ng/L (ref <18)

## 2023-02-20 MED ORDER — IBUPROFEN 400 MG PO TABS
600.0000 mg | ORAL_TABLET | Freq: Once | ORAL | Status: AC
  Administered 2023-02-20: 600 mg via ORAL
  Filled 2023-02-20: qty 1

## 2023-02-20 NOTE — ED Triage Notes (Signed)
Pt POV from home ambulatory  reporting nausea and mid lower back pain+ mild epigastric pain, seen at clinic, told to come to ED to rule out AAA. Pt also reporting R arm and neck tingling that began upon arrival, denies CP or SOB.

## 2023-02-20 NOTE — ED Provider Notes (Signed)
[facility] AT [facility] Provider Note   CSN: [id number] Arrival date & time: 02/20/23  1742     History {Add pertinent medical, surgical, social history, OB history to HPI:1} Chief Complaint  Patient presents with   Back Pain    Laura Lindsey is a 22 y.o. female past medical history significant for GAD, MDD, familial history of breast cancer presents today for nausea and mid lower back pain with mild epigastric pain.  She was seen at the [facility] walk-in clinic and told to come to the ED to rule out "AAA".  Patient denies shortness of breath, chest pain, vomiting, diarrhea, fever.  Patient endorses burning-like feeling in back and epigastric region.  She has taken diazepam and reflux medication with no relief.  Patient endorses vaping, occasional alcoholic drink, and denies smoking or illicit drug use.   Back Pain Associated symptoms: abdominal pain        Home Medications Prior to Admission medications   Medication Sig Start Date End Date Taking? Authorizing Provider  busPIRone (BUSPAR) 15 MG tablet Take 1 tablet (15 mg total) by mouth 2 (two) times daily. 01/05/23   Cherie Ouch, PA-C  citalopram (CELEXA) 40 MG tablet Take 1 tablet (40 mg total) by mouth daily. 01/05/23   Melony Overly T, PA-C  diazepam (VALIUM) 5 MG tablet Take 0.5-1 tablets (2.5-5 mg total) by mouth every 6 (six) hours as needed for anxiety. 01/05/23   Cherie Ouch, PA-C  norethindrone-ethinyl estradiol (JUNEL FE,GILDESS FE,LOESTRIN FE) 1-20 MG-MCG tablet Take 1 tablet by mouth daily.    [provider]      Allergies    Robaxin [methocarbamol]    Review of Systems   Review of Systems  Gastrointestinal:  Positive for abdominal pain and nausea.  Musculoskeletal:  Positive for back pain.    Physical Exam Updated Vital Signs BP 126/70 (BP Location: Left Arm)   Pulse 62   Temp 99.5 F (37.5 C) (Oral)   Resp 16   Ht 5\' 9"  (1.753 m)   Wt 77.1 kg   LMP 02/06/2023  (Approximate)   SpO2 99%   BMI 25.10 kg/m  Physical Exam Vitals and nursing note reviewed.  Constitutional:      General: She is not in acute distress.    Appearance: She is well-developed.  HENT:     Head: Normocephalic and atraumatic.     Right Ear: External ear normal.     Left Ear: External ear normal.  Eyes:     Conjunctiva/sclera: Conjunctivae normal.  Cardiovascular:     Rate and Rhythm: Normal rate and regular rhythm.     Pulses:          Radial pulses are 3+ on the right side and 3+ on the left side.       Dorsalis pedis pulses are 3+ on the right side and 3+ on the left side.     Heart sounds: No murmur heard. Pulmonary:     Effort: Pulmonary effort is normal. No respiratory distress.     Breath sounds: Normal breath sounds. No stridor or transmitted upper airway sounds.  Abdominal:     General: Bowel sounds are normal.     Palpations: Abdomen is soft. There is no fluid wave.     Tenderness: There is abdominal tenderness in the epigastric area. There is no right CVA tenderness, left CVA tenderness or guarding. Negative signs include Murphy's sign, Rovsing's sign and McBurney's sign.  Musculoskeletal:  General: No swelling.     Cervical back: Neck supple.     Lumbar back: Swelling and tenderness present.       Back:     Right lower leg: No edema.     Left lower leg: No edema.     Comments: Mild edema and tenderness to sacral region without erythema or signs of trauma.  Mid spine scar ranging from thoracic to lumbar spine from previous scoliosis surgery.  Skin:    General: Skin is warm and dry.     Capillary Refill: Capillary refill takes less than 2 seconds.  Neurological:     Mental Status: She is alert.  Psychiatric:        Mood and Affect: Mood normal.     ED Results / Procedures / Treatments   Labs (all labs ordered are listed, but only abnormal results are displayed) Labs Reviewed  CBC - Abnormal; Notable for the following components:       Result Value   WBC 10.7 (*)    All other components within normal limits  BASIC METABOLIC PANEL  PREGNANCY, URINE  LIPASE, BLOOD  HEPATIC FUNCTION PANEL  URINALYSIS, W/ REFLEX TO CULTURE (INFECTION SUSPECTED)  TROPONIN I (HIGH SENSITIVITY)    EKG None  Radiology No results found.  Procedures Procedures  {Document cardiac monitor, telemetry assessment procedure when appropriate:1}  Medications Ordered in ED Medications - No data to display  ED Course/ Medical Decision Making/ A&P Clinical Course as of 02/20/23 1911  Tue Feb 20, 2023  1910 BP: 126/70 [KK]    Clinical Course User Index [KK] Dolphus Jenny, PA-C   {   Click here for ABCD2, HEART and other calculatorsREFRESH Note before signing :1}                              Medical Decision Making Amount and/or Complexity of Data Reviewed Labs: ordered. Radiology: ordered.   This patient presents to the ED with chief complaint(s) of nausea, mid lower back pain and mild epigastric pain with pertinent past medical history of generalized anxiety disorder, MDD which further complicates the presenting complaint. The complaint involves an extensive differential diagnosis and also carries with it a high risk of complications and morbidity.    The differential diagnosis includes pancreatitis, GERD, gastritis,   Additional history obtained: Additional history obtained from significant other  ED Course and Reassessment: Bilateral upper extremity blood pressures taken with no notable findings. Right arm BP:134/65 Left arm BP: 126/70  Independent labs interpretation:  The following labs were independently interpreted:  BMP: No notable findings CBC: Mild leukocytosis at 10.7 Hepatic function panel: Mildly elevated bili, decreased alk phos at 28 Lipase: 32 Pregnancy urine: Negative Urinalysis: Moderate hemoglobin, rare bacteria, 15 ketones Troponin: <2 EKG: Sinus rhythm   Independent visualization of imaging: - I  independently visualized the following imaging with scope of interpretation limited to determining acute life threatening conditions related to emergency care: Chest x-ray, which revealed no acute chest findings and CT renal stone which showed  Consultation: - Consulted or discussed management/test interpretation w/ external professional: None  Consideration for admission or further workup: ***  {Document critical care time when appropriate:1} {Document review of labs and clinical decision tools ie heart score, Chads2Vasc2 etc:1}  {Document your independent review of radiology images, and any outside records:1} {Document your discussion with family members, caretakers, and with consultants:1} {Document social determinants of health affecting pt's care:1} {Document  your decision making why or why not admission, treatments were needed:1} Final Clinical Impression(s) / ED Diagnoses Final diagnoses:  None    Rx / DC Orders ED Discharge Orders     None

## 2023-02-20 NOTE — ED Notes (Signed)
Spoke with lab to add on lipase and Hep Fx panel

## 2023-02-21 NOTE — Discharge Instructions (Signed)
Today retreated for epigastric and back pain.  Please take Tylenol or ibuprofen as needed for pain.  Thank you for letting us treat you today. After reviewing your labs and imaging, I feel you are safe to go home. Please follow up with your PCP in the next several days and provide them with your records from this visit. Return to the Emergency Room if pain becomes severe or symptoms worsen.

## 2023-02-23 ENCOUNTER — Encounter: Payer: Self-pay | Admitting: Physician Assistant

## 2023-02-23 ENCOUNTER — Ambulatory Visit (INDEPENDENT_AMBULATORY_CARE_PROVIDER_SITE_OTHER): Payer: 59 | Admitting: Physician Assistant

## 2023-02-23 DIAGNOSIS — F4323 Adjustment disorder with mixed anxiety and depressed mood: Secondary | ICD-10-CM | POA: Diagnosis not present

## 2023-02-23 DIAGNOSIS — F4321 Adjustment disorder with depressed mood: Secondary | ICD-10-CM

## 2023-02-23 NOTE — Progress Notes (Signed)
[facility] Med Check  Patient ID: Laura Lindsey,  MRN: [id number]  PCP: Jarrett Soho, PA-C  Date of Evaluation: 02/23/2023 Time spent:20 minutes  Chief Complaint:  Chief Complaint   Follow-up    HISTORY/CURRENT STATUS: HPI For routine med check.   We increased the Celexa dose 6 weeks ago.  Trudie Buckler is doing quite a bit better.  She is seeing Danae Orleans [facility] every other week now which has been helpful.  She is still understandably sad about the passing of her mom in June but overall she is doing well.  Work is going fine.  Energy and motivation are good.  She has been having back pain and has seen multiple providers, no one can figure out what is wrong.  She has started working out to try to strengthen her core muscles and plans to see a chiropractor soon.  ADLs and personal hygiene are normal.  Appetite is normal.  She did gain some weight after her mom died but is trying to lose it by eating healthier and going to the gym.  Anxiety is well controlled.  She does use the Valium occasionally.  It is effective when she starts feeling panicky or overwhelmed.  No suicidal or homicidal thoughts.  Patient denies increased energy with decreased need for sleep, increased talkativeness, racing thoughts, impulsivity or risky behaviors, increased spending, increased libido, grandiosity, increased irritability or anger, paranoia, or hallucinations.  Denies dizziness, syncope, seizures, numbness, tingling, tremor, tics, unsteady gait, slurred speech, confusion. Denies muscle or joint pain, stiffness, or dystonia.  Individual Medical History/ Review of Systems: Changes? :Yes see HPI  Past medications for mental health diagnoses include: Valium for muscle spasms in the past,  Atarax, Zoloft, Cymbalta, Wellbutrin, Buspar, Celexa  Allergies: Robaxin [methocarbamol]  Current Medications:  Current Outpatient Medications:    busPIRone (BUSPAR) 15 MG tablet, Take 1 tablet (15 mg total) by mouth 2  (two) times daily., Disp: 180 tablet, Rfl: 1   citalopram (CELEXA) 40 MG tablet, Take 1 tablet (40 mg total) by mouth daily., Disp: 90 tablet, Rfl: 1   diazepam (VALIUM) 5 MG tablet, Take 0.5-1 tablets (2.5-5 mg total) by mouth every 6 (six) hours as needed for anxiety., Disp: 30 tablet, Rfl: 1   norethindrone-ethinyl estradiol (JUNEL FE,GILDESS FE,LOESTRIN FE) 1-20 MG-MCG tablet, Take 1 tablet by mouth daily., Disp: , Rfl:  Medication Side Effects: sexual dysfunction since starting the Celexa, tolerable  Family Medical/ Social History: Changes? See HPI  MENTAL HEALTH EXAM:  Last menstrual period 02/06/2023.There is no height or weight on file to calculate BMI.  General Appearance: Casual, Neat and Well Groomed  Eye Contact:  Good  Speech:  Clear and Coherent and Normal Rate  Volume:  Normal  Mood:  Euthymic  Affect:  Congruent  Thought Process:  Goal Directed and Descriptions of Associations: Circumstantial  Orientation:  Full (Time, Place, and Person)  Thought Content: Logical   Suicidal Thoughts:  No  Homicidal Thoughts:  No  Memory:  WNL  Judgement:  Good  Insight:  Good  Psychomotor Activity:  Normal  Concentration:  Concentration: Good and Attention Span: Good  Recall:  Good  Fund of Knowledge: Good  Language: Good  Assets:  Desire for Improvement  ADL's:  Intact  Cognition: WNL  Prognosis:  Good   DIAGNOSES:    ICD-10-CM   1. 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 Grief  F43.21       Receiving Psychotherapy: No  [place] in the past.  RECOMMENDATIONS: PDMP was reviewed.  Last Valium filled 01/05/2023. I provided 20 minutes of face to face time during this encounter, including time spent before and after the visit in records review, medical decision making, counseling pertinent to today's visit, and charting.   I am glad to see her doing better.  No changes will be made.  Continue BuSpar 15 mg, 1 p.o. twice daily.   Continue  Celexa 40 mg, 1 p.o. daily. Continue Valium 5 mg, 1/2-1 p.o. every 6 hours as needed anxiety.   Continue counseling with Danae Orleans. Return in 3 months.   Melony Overly, PA-C

## 2023-05-28 ENCOUNTER — Encounter: Payer: Self-pay | Admitting: Physician Assistant

## 2023-05-28 ENCOUNTER — Ambulatory Visit: Payer: 59 | Admitting: Physician Assistant

## 2023-05-28 DIAGNOSIS — F19981 Other psychoactive substance use, unspecified with psychoactive substance-induced sexual dysfunction: Secondary | ICD-10-CM | POA: Diagnosis not present

## 2023-05-28 DIAGNOSIS — F3341 Major depressive disorder, recurrent, in partial remission: Secondary | ICD-10-CM | POA: Diagnosis not present

## 2023-05-28 DIAGNOSIS — F411 Generalized anxiety disorder: Secondary | ICD-10-CM | POA: Diagnosis not present

## 2023-05-28 DIAGNOSIS — F422 Mixed obsessional thoughts and acts: Secondary | ICD-10-CM

## 2023-05-28 MED ORDER — CITALOPRAM HYDROBROMIDE 20 MG PO TABS: 30.0000 mg | ORAL_TABLET | Freq: Every day | ORAL | 1 refills | Status: DC

## 2023-05-28 NOTE — Progress Notes (Signed)
[facility]  Patient ID: Laura Lindsey,  MRN: [id number]  PCP: Jarrett Soho, PA-C  Date of Evaluation: 05/28/2023 Time spent:20 minutes  Chief Complaint:  Chief Complaint   Depression; Anxiety    HISTORY/CURRENT STATUS: HPI For routine med check.   States she is doing very well with her mental health.  Thanksgiving was hard as the first 1 without her mom.  She made it through Christmas a little bit easier.  She still has some anxiety at times but she does not often need to use the Valium.  It does work when needed.  Her main problem right now is decreased libido.  Even before she went on an antidepressant it was low.  We added Wellbutrin approximately 5 years ago for the same reason and it was not effective.  Patient is able to enjoy things.  Energy and motivation are good.  Work is going well.   No extreme sadness, tearfulness, or feelings of hopelessness.  Sleeps well most of the time. ADLs and personal hygiene are normal.   Denies any changes in concentration, making decisions, or remembering things.  Appetite has not changed.  Weight is stable.  Denies suicidal or homicidal thoughts.  Patient denies increased energy with decreased need for sleep, increased talkativeness, racing thoughts, impulsivity or risky behaviors, increased spending, increased libido, grandiosity, increased irritability or anger, paranoia, or hallucinations.  Denies dizziness, syncope, seizures, numbness, tingling, tremor, tics, unsteady gait, slurred speech, confusion. Denies muscle or joint pain, stiffness, or dystonia.  Individual Medical History/ Review of Systems: Changes? :No    Past medications for mental health diagnoses include: Valium for muscle spasms in the past,  Atarax, Zoloft, Cymbalta, Wellbutrin, Buspar, Celexa  Allergies: Robaxin [methocarbamol]  Current Medications:  Current Outpatient Medications:    busPIRone (BUSPAR) 15 MG tablet, Take 1 tablet (15 mg total) by mouth 2  (two) times daily., Disp: 180 tablet, Rfl: 1   citalopram (CELEXA) 20 MG tablet, Take 1.5 tablets (30 mg total) by mouth daily., Disp: 135 tablet, Rfl: 1   diazepam (VALIUM) 5 MG tablet, Take 0.5-1 tablets (2.5-5 mg total) by mouth every 6 (six) hours as needed for anxiety., Disp: 30 tablet, Rfl: 1   norethindrone-ethinyl estradiol (JUNEL FE,GILDESS FE,LOESTRIN FE) 1-20 MG-MCG tablet, Take 1 tablet by mouth daily., Disp: , Rfl:  Medication Side Effects: sexual dysfunction    Family Medical/ Social History: Changes? See HPI  MENTAL HEALTH EXAM:  There were no vitals taken for this visit.There is no height or weight on file to calculate BMI.  General Appearance: Casual, Neat and Well Groomed  Eye Contact:  Good  Speech:  Clear and Coherent and Normal Rate  Volume:  Normal  Mood:  Euthymic  Affect:  Congruent  Thought Process:  Goal Directed and Descriptions of Associations: Circumstantial  Orientation:  Full (Time, Place, and Person)  Thought Content: Logical   Suicidal Thoughts:  No  Homicidal Thoughts:  No  Memory:  WNL  Judgement:  Good  Insight:  Good  Psychomotor Activity:  Normal  Concentration:  Concentration: Good and Attention Span: Good  Recall:  Good  Fund of Knowledge: Good  Language: Good  Assets:  Communication Skills Desire for Improvement Financial Resources/Insurance Housing Transportation Vocational/Educational  ADL's:  Intact  Cognition: WNL  Prognosis:  Good   DIAGNOSES:    ICD-10-CM   1. Recurrent major depressive disorder, in partial remission (HCC)  F33.41     2. Mixed obsessional thoughts and acts  F42.2  3. Generalized anxiety disorder  F41.1     4. 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  RECOMMENDATIONS: PDMP was reviewed.  Last Valium filled 01/05/2023. I provided 20 minutes of face to face time during this encounter, including time spent before and  after the visit in records review, medical decision making, counseling pertinent to today's visit, and charting.   Since we tried Wellbutrin in the past and it was not effective and increasing her libido, I do not think it would be helpful to retry it.  We could gradually decrease the Celexa to see if that would help, however I am not sure it will.  I am recommending she see Dr. Grayling Congress, a sex therapist.  His name and number was given to her.  Continue BuSpar 15 mg, 1 p.o. twice daily.   Decrease Celexa to 20 mg, 1.5 pills p.o. daily. Continue Valium 5 mg, 1/2-1 p.o. every 6 hours as needed anxiety.   Continue counseling with Danae Orleans. Return in 4 to 6 weeks.  Melony Overly, PA-C

## 2023-07-01 ENCOUNTER — Other Ambulatory Visit: Payer: Self-pay | Admitting: Physician Assistant

## 2023-07-20 ENCOUNTER — Ambulatory Visit: Payer: 59 | Admitting: Physician Assistant

## 2023-07-20 ENCOUNTER — Encounter: Payer: Self-pay | Admitting: Physician Assistant

## 2023-07-20 DIAGNOSIS — F3341 Major depressive disorder, recurrent, in partial remission: Secondary | ICD-10-CM

## 2023-07-20 DIAGNOSIS — F19981 Other psychoactive substance use, unspecified with psychoactive substance-induced sexual dysfunction: Secondary | ICD-10-CM

## 2023-07-20 DIAGNOSIS — F411 Generalized anxiety disorder: Secondary | ICD-10-CM | POA: Diagnosis not present

## 2023-07-20 MED ORDER — CITALOPRAM HYDROBROMIDE 20 MG PO TABS: 20.0000 mg | ORAL_TABLET | Freq: Every day | ORAL | Status: DC

## 2023-07-20 NOTE — Progress Notes (Signed)
 [facility]  Patient ID: Laura Lindsey,  MRN: [id number]  PCP: Jarrett Soho, PA-C  Date of Evaluation: 07/20/2023 Time spent:20 minutes  Chief Complaint:  Chief Complaint   Depression; Anxiety; Follow-up    HISTORY/CURRENT STATUS: HPI For routine med check.   We decreased the Celexa at the last visit.  She has tolerated it well.  No change in mood.  She is able to enjoy things.  She is getting married in October and is excited about that.  She has found her wedding dress!  Energy and motivation are good.  Appetite and weight are stable.  ADLs and personal hygiene are normal.  Work is going well.  She is sleeping okay.  No increase in anxiety.  The BuSpar has been effective in preventing that.  She rarely takes the Valium.  No suicidal or homicidal thoughts.  Since we decreased the Celexa as she has not had any improvement in sexual side effects.  She is now seeing a sex therapist at [facility].  She has seen the therapist twice and thinks it will be helpful.  Denies dizziness, syncope, seizures, numbness, tingling, tremor, tics, unsteady gait, slurred speech, confusion. Denies muscle or joint pain, stiffness, or dystonia.  Individual Medical History/ Review of Systems: Changes? :No    Past medications for mental health diagnoses include: Valium for muscle spasms in the past,  Atarax, Zoloft, Cymbalta, Wellbutrin, Buspar, Celexa  Allergies: Robaxin [methocarbamol]  Current Medications:  Current Outpatient Medications:    busPIRone (BUSPAR) 15 MG tablet, TAKE 1 TABLET BY MOUTH 2 TIMES DAILY., Disp: 180 tablet, Rfl: 1   diazepam (VALIUM) 5 MG tablet, Take 0.5-1 tablets (2.5-5 mg total) by mouth every 6 (six) hours as needed for anxiety., Disp: 30 tablet, Rfl: 1   norethindrone-ethinyl estradiol (JUNEL FE,GILDESS FE,LOESTRIN FE) 1-20 MG-MCG tablet, Take 1 tablet by mouth daily., Disp: , Rfl:    citalopram (CELEXA) 20 MG tablet, Take 1 tablet (20 mg total) by mouth  daily., Disp: , Rfl:  Medication Side Effects: sexual dysfunction    Family Medical/ Social History: Changes? See HPI  MENTAL HEALTH EXAM:  There were no vitals taken for this visit.There is no height or weight on file to calculate BMI.  General Appearance: Casual, Neat and Well Groomed  Eye Contact:  Good  Speech:  Clear and Coherent and Normal Rate  Volume:  Normal  Mood:  Euthymic  Affect:  Congruent  Thought Process:  Goal Directed and Descriptions of Associations: Circumstantial  Orientation:  Full (Time, Place, and Person)  Thought Content: Logical   Suicidal Thoughts:  No  Homicidal Thoughts:  No  Memory:  WNL  Judgement:  Good  Insight:  Good  Psychomotor Activity:  Normal  Concentration:  Concentration: Good and Attention Span: Good  Recall:  Good  Fund of Knowledge: Good  Language: Good  Assets:  Communication Skills Desire for Improvement Financial Resources/Insurance Housing Resilience Transportation Vocational/Educational  ADL's:  Intact  Cognition: WNL  Prognosis:  Good   DIAGNOSES:    ICD-10-CM   1. Recurrent major depressive disorder, in partial remission (HCC)  F33.41     2. Generalized anxiety disorder  F41.1     3. 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  RECOMMENDATIONS: PDMP was reviewed.  Last Valium filled 01/05/2023. I provided 20  minutes of face to face time during this  encounter, including time spent before and after the visit in records review, medical decision making, counseling pertinent to today's visit, and charting.   I am glad she is seeing a sex therapist now.  She has seen her twice and will continue.  We agreed that the Celexa is part of the problem and will decrease further since she has tolerated the 10 mg drop from almost 2 months ago.  Continue BuSpar 15 mg, 1 p.o. twice daily.   Decrease Celexa to 20 mg,  1 p.o. daily.   Continue Valium 5 mg, 1/2-1 p.o. every 6 hours as needed anxiety.   Continue therapy counseling with  Salomon Fick. Return in 2 months.  Melony Overly, PA-C

## 2023-09-25 ENCOUNTER — Encounter: Payer: Self-pay | Admitting: Physician Assistant

## 2023-09-25 ENCOUNTER — Ambulatory Visit (INDEPENDENT_AMBULATORY_CARE_PROVIDER_SITE_OTHER): Admitting: Physician Assistant

## 2023-09-25 DIAGNOSIS — F4323 Adjustment disorder with mixed anxiety and depressed mood: Secondary | ICD-10-CM

## 2023-09-25 DIAGNOSIS — F19981 Other psychoactive substance use, unspecified with psychoactive substance-induced sexual dysfunction: Secondary | ICD-10-CM

## 2023-09-25 DIAGNOSIS — F422 Mixed obsessional thoughts and acts: Secondary | ICD-10-CM | POA: Diagnosis not present

## 2023-09-25 MED ORDER — CITALOPRAM HYDROBROMIDE 20 MG PO TABS: 20.0000 mg | ORAL_TABLET | Freq: Every day | ORAL | 1 refills | Status: DC

## 2023-09-25 NOTE — Progress Notes (Unsigned)
 [facility]  Patient ID: Laura Lindsey,  MRN: [id number]  PCP: Darnelle Elders, PA-C  Date of Evaluation: 09/25/2023 Time spent:20 minutes  Chief Complaint:  Chief Complaint   Anxiety; Depression; Follow-up    HISTORY/CURRENT STATUS: HPI For routine med check.   We decreased the Celexa  a couple of months ago, hoping it would help decrease the sexual side effects.  It has not helped at all.  She still has no desire.  She is getting married in the fall and really wants help with this issue.  Josephine Nicolas is seeing a sex therapist.  She has discussed the issue with her gynecologist as well who suggested she talk to be about the drug Addyi.   Her mood is fine even with the lower dose of Celexa . Patient is able to enjoy things.  Energy and motivation are good.  Work is going well.   No extreme sadness, tearfulness, or feelings of hopelessness.  Sleeps well most of the time.  ADLs and personal hygiene are normal.   Denies any changes in concentration, making decisions, or remembering things.  Appetite has not changed.  Weight is stable.  She does get anxious at times, more so situational, not having panic attacks.  She and her fianc are looking for a house which is stressful and also planning her wedding in October.  She takes the Valium  occasionally and it is still effective.  Denies suicidal or homicidal thoughts.  Patient denies increased energy with decreased need for sleep, increased talkativeness, racing thoughts, impulsivity or risky behaviors, increased spending, increased libido, grandiosity, increased irritability or anger, paranoia, or hallucinations.  Denies dizziness, syncope, seizures, numbness, tingling, tremor, tics, unsteady gait, slurred speech, confusion. Denies muscle or joint pain, stiffness, or dystonia.  Individual Medical History/ Review of Systems: Changes? :No    Past medications for mental health diagnoses include: Valium  for muscle spasms in the past,  Atarax ,  Zoloft, Cymbalta , Wellbutrin , Buspar , Celexa   Allergies: Robaxin [methocarbamol]  Current Medications:  Current Outpatient Medications:    busPIRone  (BUSPAR ) 15 MG tablet, TAKE 1 TABLET BY MOUTH 2 TIMES DAILY., Disp: 180 tablet, Rfl: 1   diazepam  (VALIUM ) 5 MG tablet, Take 0.5-1 tablets (2.5-5 mg total) by mouth every 6 (six) hours as needed for anxiety., Disp: 30 tablet, Rfl: 1   Flibanserin (ADDYI) 100 MG TABS, Take 1 tablet (100 mg total) by mouth at bedtime., Disp: 30 tablet, Rfl: 2   norethindrone-ethinyl estradiol (JUNEL FE,GILDESS FE,LOESTRIN FE) 1-20 MG-MCG tablet, Take 1 tablet by mouth daily., Disp: , Rfl:    citalopram  (CELEXA ) 20 MG tablet, Take 1 tablet (20 mg total) by mouth daily., Disp: 90 tablet, Rfl: 1 Medication Side Effects: sexual dysfunction    Family Medical/ Social History: Changes? See HPI  MENTAL HEALTH EXAM:  There were no vitals taken for this visit.There is no height or weight on file to calculate BMI.  General Appearance: Casual, Neat and Well Groomed  Eye Contact:  Good  Speech:  Clear and Coherent and Normal Rate  Volume:  Normal  Mood:  Euthymic  Affect:  Congruent  Thought Process:  Goal Directed and Descriptions of Associations: Circumstantial  Orientation:  Full (Time, Place, and Person)  Thought Content: Logical   Suicidal Thoughts:  No  Homicidal Thoughts:  No  Memory:  WNL  Judgement:  Good  Insight:  Good  Psychomotor Activity:  Normal  Concentration:  Concentration: Good and Attention Span: Good  Recall:  Good  Fund of Knowledge: Good  Language:  Good  Assets:  Communication Skills Desire for Improvement Financial Resources/Insurance Housing Physical Health Resilience Transportation Vocational/Educational  ADL's:  Intact  Cognition: WNL  Prognosis:  Good   DIAGNOSES:    ICD-10-CM   1. 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 Mixed obsessional thoughts and acts  F42.2     3. Sexual dysfunction due to  psychoactive substance [facility])  F19.981      Receiving Psychotherapy: Yes  Kate Pak at [facility] in the past.  RECOMMENDATIONS: PDMP was reviewed.  Last Valium  filled 01/05/2023. I provided 20 minutes of face to face time during this encounter, including time spent before and after the visit in records review, medical decision making, counseling pertinent to today's visit, and charting.   I researched the Addyi and it is appropriate for her.  Benefits, risks, SE discussed, she accepts.  Continue BuSpar  15 mg, 1 p.o. twice daily.   Continue Celexa  20 mg, 1 p.o. daily.   Continue Valium  5 mg, 1/2-1 p.o. every 6 hours as needed anxiety.   Start Addyi 100 mg, 1 p.o. nightly. Continue therapy counseling with  Kennedy Guidry. Return in 3 months.  Marvia Slocumb, PA-C

## 2023-09-27 ENCOUNTER — Other Ambulatory Visit: Payer: Self-pay | Admitting: Physician Assistant

## 2023-09-27 MED ORDER — ADDYI 100 MG PO TABS: 100.0000 mg | ORAL_TABLET | Freq: Every evening | ORAL | 2 refills | Status: DC

## 2023-09-27 NOTE — Telephone Encounter (Signed)
 Question if a PA would be appropriate. Cost with [facility] is $298.

## 2023-09-28 NOTE — Telephone Encounter (Signed)
 Will submit PA

## 2023-10-01 NOTE — Telephone Encounter (Signed)
 Prior approval received effective 09/28/23-09/27/24 with [facility] [employer] for Addyi 

## 2023-10-01 NOTE — Telephone Encounter (Signed)
 Noted

## 2023-11-04 ENCOUNTER — Other Ambulatory Visit: Payer: Self-pay | Admitting: Physician Assistant

## 2023-12-19 ENCOUNTER — Other Ambulatory Visit: Payer: Self-pay | Admitting: Physician Assistant

## 2023-12-26 ENCOUNTER — Encounter: Payer: Self-pay | Admitting: Physician Assistant

## 2023-12-26 ENCOUNTER — Ambulatory Visit (INDEPENDENT_AMBULATORY_CARE_PROVIDER_SITE_OTHER): Admitting: Physician Assistant

## 2023-12-26 DIAGNOSIS — F422 Mixed obsessional thoughts and acts: Secondary | ICD-10-CM | POA: Diagnosis not present

## 2023-12-26 DIAGNOSIS — F19981 Other psychoactive substance use, unspecified with psychoactive substance-induced sexual dysfunction: Secondary | ICD-10-CM

## 2023-12-26 DIAGNOSIS — F4323 Adjustment disorder with mixed anxiety and depressed mood: Secondary | ICD-10-CM | POA: Diagnosis not present

## 2023-12-26 MED ORDER — DIAZEPAM 5 MG PO TABS: 2.5000 mg | ORAL_TABLET | Freq: Four times a day (QID) | ORAL | 1 refills | Status: AC | PRN

## 2023-12-26 NOTE — Progress Notes (Signed)
 [facility]  Patient ID: Laura Lindsey,  MRN: [id number]  PCP: Katina Pfeiffer, PA-C  Date of Evaluation: 12/26/2023 Time spent:25 minutes  Chief Complaint:  Chief Complaint   Anxiety; Depression; Follow-up   Virtual Visit via Telehealth  I connected with patient by telephone, with their informed consent, and verified patient privacy and that I am speaking with the correct person using two identifiers.  I am private, in my office and the patient is at home.  I discussed the limitations, risks, security and privacy concerns of performing an evaluation and management service by telephone and the availability of in person appointments. I also discussed with the patient that there may be a patient responsible charge related to this service. The patient expressed understanding and agreed to proceed.   I discussed the assessment and treatment plan with the patient. The patient was provided an opportunity to ask questions and all were answered. The patient agreed with the plan and demonstrated an understanding of the instructions.   The patient was advised to call back or seek an in-person evaluation if the symptoms worsen or if the condition fails to improve as anticipated.  I provided approximately 25 minutes of non-face-to-face time during this encounter.  HISTORY/CURRENT STATUS: HPI For routine med check.   A lot of good things are happening! She and fiance bought a home; they haven't moved in yet but will soon.  She will be getting married next month. The Addyi  has been very helpful for the sexual SE from the Celexa .  Work is going well. Anxiety is controlled with Buspar  and prn Valium .   Patient is able to enjoy things.  Energy and motivation are good.  No extreme sadness, tearfulness, or feelings of hopelessness.  Sleeps well most of the time. ADLs and personal hygiene are normal.   Denies any changes in concentration, making decisions, or remembering things.  Appetite has  not changed.  Weight is stable.  No mania, delirium, AH/VH.  No SI/HI.  Individual Medical History/ Review of Systems: Changes? :No    Past medications for mental health diagnoses include: Valium  for muscle spasms in the past,  Atarax , Zoloft, Cymbalta , Wellbutrin , Buspar , Celexa   Allergies: Robaxin [methocarbamol]  Current Medications:  Current Outpatient Medications:    busPIRone  (BUSPAR ) 15 MG tablet, TAKE 1 TABLET BY MOUTH TWICE A DAY, Disp: 180 tablet, Rfl: 1   citalopram  (CELEXA ) 20 MG tablet, Take 1 tablet (20 mg total) by mouth daily., Disp: 90 tablet, Rfl: 1   Flibanserin  (ADDYI ) 100 MG TABS, TAKE 1 TABLET BY MOUTH EVERYDAY AT BEDTIME, Disp: 90 tablet, Rfl: 0   norethindrone-ethinyl estradiol (JUNEL FE,GILDESS FE,LOESTRIN FE) 1-20 MG-MCG tablet, Take 1 tablet by mouth daily., Disp: , Rfl:    diazepam  (VALIUM ) 5 MG tablet, Take 0.5-1 tablets (2.5-5 mg total) by mouth every 6 (six) hours as needed for anxiety., Disp: 30 tablet, Rfl: 1 Medication Side Effects: sexual dysfunction    Family Medical/ Social History: Changes? See HPI  MENTAL HEALTH EXAM:  There were no vitals taken for this visit.There is no height or weight on file to calculate BMI.  General Appearance: Unable to assess  Eye Contact:  Unable to assess  Speech:  Clear and Coherent and Normal Rate  Volume:  Normal  Mood:  Euthymic  Affect:  Unable to assess  Thought Process:  Goal Directed and Descriptions of Associations: Circumstantial  Orientation:  Full (Time, Place, and Person)  Thought Content: Logical   Suicidal Thoughts:  No  Homicidal Thoughts:  No  Memory:  WNL  Judgement:  Good  Insight:  Good  Psychomotor Activity:  Unable to assess  Concentration:  Concentration: Good and Attention Span: Good  Recall:  Good  Fund of Knowledge: Good  Language: Good  Assets:  Communication Skills Desire for Improvement Financial Resources/Insurance Housing Physical  Health Resilience Transportation Vocational/Educational  ADL's:  Intact  Cognition: WNL  Prognosis:  Good   DIAGNOSES:    ICD-10-CM   1. 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 Mixed obsessional thoughts and acts  F42.2     3. 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  RECOMMENDATIONS: PDMP was reviewed.  Last Valium  filled 01/05/2023. I provided approximately  25  minutes of non-face to face time during this encounter, including time spent before and after the visit in records review, medical decision making, counseling pertinent to today's visit, and charting.   Ike is doing well so no changes in treatment need to be made.  Continue BuSpar  15 mg, 1 p.o. twice daily.   Continue Celexa  20 mg, 1 p.o. daily.   Continue Valium  5 mg, 1/2-1 p.o. every 6 hours as needed anxiety.   Continue Addyi  100 mg, 1 p.o. nightly. Continue therapy counseling with  Kennedy Guidry. Return in 4 months.  Verneita Cooks, PA-C

## 2023-12-28 ENCOUNTER — Ambulatory Visit: Admitting: Physician Assistant

## 2024-03-08 ENCOUNTER — Other Ambulatory Visit: Payer: Self-pay | Admitting: Physician Assistant

## 2024-04-07 ENCOUNTER — Telehealth: Payer: Self-pay | Admitting: Physician Assistant

## 2024-04-07 NOTE — Telephone Encounter (Signed)
 Recommended patient call pharmacy and they can run all her meds and let her know if there are any contraindications.

## 2024-04-07 NOTE — Telephone Encounter (Signed)
 Pt called, asking for advise. If ok to take Vitamin B & D with Rx meds? Return call [phone]

## 2024-04-11 ENCOUNTER — Other Ambulatory Visit: Payer: Self-pay | Admitting: Medical Genetics

## 2024-05-02 ENCOUNTER — Ambulatory Visit: Admitting: Physician Assistant

## 2024-05-07 ENCOUNTER — Ambulatory Visit: Admitting: Physician Assistant

## 2024-05-07 ENCOUNTER — Encounter: Payer: Self-pay | Admitting: Physician Assistant

## 2024-05-07 DIAGNOSIS — F19981 Other psychoactive substance use, unspecified with psychoactive substance-induced sexual dysfunction: Secondary | ICD-10-CM | POA: Diagnosis not present

## 2024-05-07 DIAGNOSIS — F411 Generalized anxiety disorder: Secondary | ICD-10-CM

## 2024-05-07 DIAGNOSIS — F422 Mixed obsessional thoughts and acts: Secondary | ICD-10-CM | POA: Diagnosis not present

## 2024-05-07 DIAGNOSIS — F3342 Major depressive disorder, recurrent, in full remission: Secondary | ICD-10-CM | POA: Diagnosis not present

## 2024-05-07 MED ORDER — ADDYI 100 MG PO TABS: 1.0000 | ORAL_TABLET | Freq: Every day | ORAL | 1 refills | Status: AC

## 2024-05-07 MED ORDER — BUSPIRONE HCL 15 MG PO TABS: 15.0000 mg | ORAL_TABLET | Freq: Two times a day (BID) | ORAL | 1 refills | Status: AC

## 2024-05-07 MED ORDER — CITALOPRAM HYDROBROMIDE 20 MG PO TABS: 20.0000 mg | ORAL_TABLET | Freq: Every day | ORAL | 1 refills | Status: AC

## 2024-05-07 NOTE — Progress Notes (Signed)
 "     [facility]  Patient ID: Laura Lindsey,  MRN: [id number]  PCP: Katina Pfeiffer, PA-C  Date of Evaluation: 05/07/2024 Time spent:20 minutes  Chief Complaint:  Chief Complaint   Anxiety; Depression    HISTORY/CURRENT STATUS: HPI For routine med check.   Laura Lindsey got married since our LOV! Very happy.  Still has decreased libido but hard to say if the Addyi  isn't working now or not.  So much has been going on with the wedding, the holidays, etc. She doesn't feel like any changes are needed.   She is able to enjoy things.  Loves taking care of her plants.  Energy and motivation are good.  Work is going well.   No extreme sadness, tearfulness, or feelings of hopelessness.  Sleeps ok.  ADLs and personal hygiene are normal.   Denies any changes in concentration, making decisions, or remembering things.  Appetite has not changed.  Weight is stable.   Anxiety is well controlled w/ current meds.  She sometimes takes the Valium  but not often.  No mania, delirium, AH/VH.  No SI/HI.  Individual Medical History/ Review of Systems: Changes? :No    Past medications for mental health diagnoses include: Valium  for muscle spasms in the past,  Atarax , Zoloft, Cymbalta , Wellbutrin , Buspar , Celexa   Allergies: Robaxin [methocarbamol]  Current Medications:  Current Outpatient Medications:    Cyanocobalamin (VITAMIN B 12 PO), Take by mouth., Disp: , Rfl:    diazepam  (VALIUM ) 5 MG tablet, Take 0.5-1 tablets (2.5-5 mg total) by mouth every 6 (six) hours as needed for anxiety., Disp: 30 tablet, Rfl: 1   norethindrone-ethinyl estradiol (JUNEL FE,GILDESS FE,LOESTRIN FE) 1-20 MG-MCG tablet, Take 1 tablet by mouth daily., Disp: , Rfl:    VITAMIN D, CHOLECALCIFEROL, PO, Take by mouth., Disp: , Rfl:    busPIRone  (BUSPAR ) 15 MG tablet, Take 1 tablet (15 mg total) by mouth 2 (two) times daily., Disp: 180 tablet, Rfl: 1   citalopram  (CELEXA ) 20 MG tablet, Take 1 tablet (20 mg total) by mouth daily., Disp: 90  tablet, Rfl: 1   Flibanserin  (ADDYI ) 100 MG TABS, Take 1 tablet (100 mg total) by mouth daily in the afternoon., Disp: 90 tablet, Rfl: 1 Medication Side Effects: sexual dysfunction    Family Medical/ Social History: Changes? See HPI  MENTAL HEALTH EXAM:  There were no vitals taken for this visit.There is no height or weight on file to calculate BMI.  General Appearance: Casual and Well Groomed  Eye Contact:  Good  Speech:  Clear and Coherent and Normal Rate  Volume:  Normal  Mood:  Euthymic  Affect:  Congruent  Thought Process:  Goal Directed and Descriptions of Associations: Circumstantial  Orientation:  Full (Time, Place, and Person)  Thought Content: Logical   Suicidal Thoughts:  No  Homicidal Thoughts:  No  Memory:  WNL  Judgement:  Good  Insight:  Good  Psychomotor Activity:  Normal  Concentration:  Concentration: Good and Attention Span: Good  Recall:  Good  Fund of Knowledge: Good  Language: Good  Assets:  Communication Skills Desire for Improvement Financial Resources/Insurance Housing Physical Health Resilience Transportation Vocational/Educational  ADL's:  Intact  Cognition: WNL  Prognosis:  Good   DIAGNOSES:    ICD-10-CM   1. Recurrent major depression in full remission  F33.42     2. Mixed obsessional thoughts and acts  F42.2     3. Generalized anxiety disorder  F41.1     4. Sexual dysfunction due to  psychoactive substance (HCC)  Q80.018       Receiving Psychotherapy: Yes  Nyle Child at [facility] in the past.  RECOMMENDATIONS: PDMP was reviewed.  Last Valium  filled 12/26/2023. I provided approximately  20  minutes of face to face time during this encounter, including time spent before and after the visit in records review, medical decision making, counseling pertinent to today's visit, and charting.   Congratulations on her marriage!  Current meds are effective so no changes are needed.  If the sexual SE contrinue to be a prob  she'll let me know.   Continue BuSpar  15 mg, 1 p.o. twice daily.   Continue Celexa  20 mg, 1 p.o. daily.   Continue Valium  5 mg, 1/2-1 p.o. every 6 hours as needed anxiety.   Continue Addyi  100 mg, 1 p.o. nightly. Continue therapy counseling with  Kennedy Guidry. Return in 6 months.  Verneita Cooks, PA-C  "

## 2024-11-05 ENCOUNTER — Ambulatory Visit: Admitting: Physician Assistant
